# Patient Record
Sex: Male | Born: 1950 | Race: Asian | Hispanic: No | Marital: Married | Smoking: Current every day smoker
Health system: Southern US, Community
[De-identification: ages and names within clinical notes are randomized; demographics above are authoritative.]

## PROBLEM LIST (undated history)

## (undated) DIAGNOSIS — F17200 Nicotine dependence, unspecified, uncomplicated: Secondary | ICD-10-CM

## (undated) DIAGNOSIS — I10 Essential (primary) hypertension: Secondary | ICD-10-CM

## (undated) DIAGNOSIS — I2510 Atherosclerotic heart disease of native coronary artery without angina pectoris: Secondary | ICD-10-CM

## (undated) DIAGNOSIS — K219 Gastro-esophageal reflux disease without esophagitis: Secondary | ICD-10-CM

## (undated) DIAGNOSIS — D352 Benign neoplasm of pituitary gland: Secondary | ICD-10-CM

## (undated) DIAGNOSIS — A99 Unspecified viral hemorrhagic fever: Secondary | ICD-10-CM

## (undated) HISTORY — DX: Gastro-esophageal reflux disease without esophagitis: K21.9

## (undated) HISTORY — DX: Nicotine dependence, unspecified, uncomplicated: F17.200

## (undated) HISTORY — DX: Essential (primary) hypertension: I10

## (undated) HISTORY — DX: Benign neoplasm of pituitary gland: D35.2

## (undated) HISTORY — PX: CARDIAC CATHETERIZATION: SHX172

## (undated) HISTORY — DX: Unspecified viral hemorrhagic fever: A99

## (undated) HISTORY — PX: ESOPHAGOGASTRODUODENOSCOPY: SHX1529

## (undated) HISTORY — PX: APPENDECTOMY: SHX54

## (undated) HISTORY — DX: Atherosclerotic heart disease of native coronary artery without angina pectoris: I25.10

---

## 1991-03-04 DIAGNOSIS — A99 Unspecified viral hemorrhagic fever: Secondary | ICD-10-CM

## 1991-03-04 HISTORY — DX: Unspecified viral hemorrhagic fever: A99

## 2009-03-03 DIAGNOSIS — I2510 Atherosclerotic heart disease of native coronary artery without angina pectoris: Secondary | ICD-10-CM

## 2009-03-03 HISTORY — DX: Atherosclerotic heart disease of native coronary artery without angina pectoris: I25.10

## 2011-01-09 ENCOUNTER — Encounter: Payer: Self-pay | Admitting: Family Medicine

## 2011-01-09 ENCOUNTER — Ambulatory Visit (INDEPENDENT_AMBULATORY_CARE_PROVIDER_SITE_OTHER): Payer: PRIVATE HEALTH INSURANCE | Admitting: Family Medicine

## 2011-01-09 VITALS — BP 143/87 | HR 71 | Temp 97.9°F | Wt 192.0 lb

## 2011-01-09 DIAGNOSIS — R7309 Other abnormal glucose: Secondary | ICD-10-CM

## 2011-01-09 DIAGNOSIS — Z9861 Coronary angioplasty status: Secondary | ICD-10-CM

## 2011-01-09 DIAGNOSIS — F17200 Nicotine dependence, unspecified, uncomplicated: Secondary | ICD-10-CM

## 2011-01-09 DIAGNOSIS — R7303 Prediabetes: Secondary | ICD-10-CM

## 2011-01-09 DIAGNOSIS — Z955 Presence of coronary angioplasty implant and graft: Secondary | ICD-10-CM | POA: Insufficient documentation

## 2011-01-09 DIAGNOSIS — R198 Other specified symptoms and signs involving the digestive system and abdomen: Secondary | ICD-10-CM | POA: Insufficient documentation

## 2011-01-09 DIAGNOSIS — E119 Type 2 diabetes mellitus without complications: Secondary | ICD-10-CM

## 2011-01-09 LAB — COMPREHENSIVE METABOLIC PANEL
ALT: 20 U/L (ref 0–53)
Alkaline Phosphatase: 87 U/L (ref 39–117)
CO2: 28 mEq/L (ref 19–32)
Creatinine, Ser: 0.8 mg/dL (ref 0.4–1.5)
GFR: 101.71 mL/min (ref >60.00)
Sodium: 139 mEq/L (ref 135–145)
Total Bilirubin: 0.8 mg/dL (ref 0.3–1.2)

## 2011-01-09 LAB — CBC WITH DIFFERENTIAL/PLATELET
Basophils Absolute: 0.1 10*3/uL (ref 0.0–0.1)
Basophils Relative: 0.8 % (ref 0.0–3.0)
Eosinophils Relative: 1.4 % (ref 0.0–5.0)
HCT: 43.5 % (ref 39.0–52.0)
Hemoglobin: 14.6 g/dL (ref 13.0–17.0)
Lymphocytes Relative: 21.7 % (ref 12.0–46.0)
Lymphs Abs: 1.7 10*3/uL (ref 0.7–4.0)
Monocytes Relative: 6.6 % (ref 3.0–12.0)
Neutro Abs: 5.4 10*3/uL (ref 1.4–7.7)
RBC: 4.59 Mil/uL (ref 4.22–5.81)
WBC: 7.8 10*3/uL (ref 4.5–10.5)

## 2011-01-09 LAB — LIPID PANEL
Cholesterol: 196 mg/dL (ref 0–200)
HDL: 44.1 mg/dL (ref >39.00)
Total CHOL/HDL Ratio: 4
Triglycerides: 285 mg/dL — ABNORMAL HIGH (ref 0.0–149.0)
VLDL: 57 mg/dL — ABNORMAL HIGH (ref 0.0–40.0)

## 2011-01-09 LAB — LDL CHOLESTEROL, DIRECT: Direct LDL: 106 mg/dL

## 2011-01-09 NOTE — Assessment & Plan Note (Signed)
It appears this was done as a preventative measure per pt and interpreter reports today.  No old records available. He has been on plavix for over a year.  I told him to d/c plavix and continue ASA 81mg  qd. He says he has good exercise tolerance when he does his brisk walking.

## 2011-01-09 NOTE — Progress Notes (Signed)
Quick Note:  Pls notify pt's daughter that his labs showed that he has diabetes that he needs to start taking medication for (he reported to me that he had "mild" diabetes or "borderline" diabetes...also described it as "sugar a little bit up, but not diabetes"). I'll do eRx for metformin. Have them arrange a f/u visit in a week or so and we'll go over his labs in detail, we'll talk about nutritionist consult for diabetic diet teaching, and I'll answer their questions. Thx--PM ______

## 2011-01-09 NOTE — Patient Instructions (Signed)
Abdominal Aortic Aneurysm  An aneurysm is the enlargement (dilatation), bulging, or ballooning out of part of the wall of a vein or artery. An aortic aneurysm is a bulging in the largest artery of the body. This artery supplies blood from the heart to the rest of the body.  The first part of the aorta is called the thoracic aorta. It leaves the heart, rises (ascends), arches, and goes down (descends) through the chest until it reaches the diaphragm. The diaphragm is the muscular part between the chest and abdomen.   The second part of the aorta is called the abdominal aorta after it has passed the diaphragm and continues down through the abdomen. The abdominal aorta ends where it splits to form the two iliac arteries that go to the legs.  Aortic aneurysms can develop anywhere along the length of the aorta. The majority are located along the abdominal aorta. The major concern with an aortic aneurysm is that it can enlarge and rupture. This can cause death unless diagnosed and treated promptly. Aneurysms can also develop blood clots or infections. CAUSES  Many aortic aneurysms are caused by arteriosclerosis. Arteriosclerosis can weaken the aortic wall. The pressure of the blood being pumped through the aorta causes it to balloon out at the site of weakness. Therefore, high blood pressure (hypertension) is associated with aneurysm. Other risk factors include:  Age over 13.   Tobacco use.   Being male.   White race.   Family history of aneurysm.   Less frequent causes of abdominal aortic aneurysms include:   Connective tissue diseases.   Abdominal trauma.   Inflammation of blood vessles (arteritis).   Inherited (congenital) malformations.   Infection.  SYMPTOMS  The signs and symptoms of an unruptured aneurysm will partly depend on its size and rate of growth.   Abdominal aortic aneurysms may cause pain. The pain typically has a deep quality as if it is piercing into the person. It is  felt most often in the lower back area. The pain is usually steady but may be relieved by changing your body position.   The person may also become aware of an abnormally prominent pulse in the belly (abdominal pulsation).  DIAGNOSIS  An aortic aneurysm may be discovered by chance on physical exam, or on X-ray studies done for other reasons. It may be suspected because of other problems such as back or abdominal pain. The following tests may help identify the problem.  X-rays of the abdomen can show calcium deposits in the aneurysm wall.   CT scanning of the abdomen, particularly with contrast medium, is accurate at showing the exact size and shape of the aneurysm.   Ultrasounds give a clear picture of the size of an aneurysm (about 98% accuracy).   MRI scanning is accurate, but often unnecessary.   An abdominal angiogram shows the source of the major blood vessels arising from the aorta. It reveals the size and extent of any aneurysm. It can also show a clot clinging to the wall of the aneurysm (mural thrombus).  TREATMENT  Treating an abdominal aortic aneurysm depends on the size. A rupture of an aneurysm is uncommon when they are less than 5 cm wide (2 inches). Rupture is far more common in aneurysms that are over 6 cm wide (2.4 inches).  Surgical repair is usually recommended for all aneurysms over 6 cm wide (2.4 inches). This depends on the health, age, and other circumstances of the individual. This type of surgery consists of  opening the abdomen, removing the aneurysm, and sewing a synthetic graft (similar to a cloth tube) in its place. A less invasive form of this surgery, using stent grafts, is sometimes recommended.   For most patients, elective repair is recommended for aneurysms between 4 and 6 cm (1.6 and 2.4 inches). Elective means the surgery can be done at your convenience. This should not be put off too long if surgery is recommended.   If you smoke, stop immediately. Smoking  is a major risk factor for enlargement and rupture.   Medications may be used to help decrease complications - these include medicine to lower blood pressure and control cholesterol.  HOME CARE INSTRUCTIONS   If you smoke, stop. Do not start smoking.   Take all medications as prescribed.   Your caregiver will tell you when to have your aneurysm rechecked, either by ultrasound or CT scan.   If your caregiver has given you a follow-up appointment, it is very important to keep that appointment. Not keeping the appointment could result in a chronic or permanent injury, pain, or disability. If there is any problem keeping the appointment, you must call back to this facility for assistance.  SEEK MEDICAL CARE IF:   You develop mild abdominal pain or pressure.   You are able to feel or perceive your aneurysm, and you sense any change.  SEEK IMMEDIATE MEDICAL CARE IF:   You develop severe abdominal pain, or severe pain moving (radiating) to your back.   You suddenly develop cold or blue toes or feet.   You suddenly develop lightheadedness or fainting spells.  MAKE SURE YOU:   Understand these instructions.   Will watch your condition.   Will get help right away if you are not doing well or get worse.  Document Released: 11/27/2004 Document Revised: 10/30/2010 Document Reviewed: 09/21/2007 [facility] Patient Information 2012 [place], [place].

## 2011-01-09 NOTE — Assessment & Plan Note (Signed)
Encouraged complete cessation.  Sounds like he is cutting back a little bit lately. Will discuss more in depth in the future.

## 2011-01-09 NOTE — Assessment & Plan Note (Signed)
I find it difficult to believe he was told he has "a little bit" of diabetes, yet was not placed on any medication nor was he given any recommendations/education about diabetic diet per his report.  Will check HbA1c, CMET, TSH, FLP (he is fasting today).

## 2011-01-09 NOTE — Progress Notes (Signed)
Office Note 01/09/2011  CC:  Chief Complaint  Patient presents with  . Establish Care    HPI:  Lawrence Randall is a 60 y.o. Asian male who is here to establish care. Patient's most recent primary MD: [facility]. Old records were not reviewed prior to or during today's visit.  He is here with his interpreter, Denice Bors.  He knows mandarin only, no english. He is here to visit his daughter for 1 yr, then will return to [place].   Past Medical History  Diagnosis Date  . Borderline diabetes 2012    "sugar a little bit up but not diabetes" is how the patient stated it (no records available)  . Hemorrhagic, fever 1993    No residual effects  . Coronary artery disease 2011    Stents x3, put on plavix and ASA--with further questioning he apparently had no symptoms but he told an MD in [place] that he had some SOB at times and the MD put the stents in as a PROPHYLAXIS against CAD (pt says actual ischemia or CAD was NOT confirmed--no records avail.  . Tobacco dependence     1/2 ppd x 40 yrs  He denies any history of hypertension  Past Surgical History  Procedure Date  . Appendectomy age 28  . Esophagogastroduodenoscopy     Done for upper abd pains: normal per pt report.    Family History  Problem Relation Age of Onset  . Heart disease Mother     chf  . Diabetes Mother     History   Social History  . Marital Status: Married    Spouse Name: N/A    Number of Children: N/A  . Years of Education: N/A   Occupational History  . Not on file.   Social History Main Topics  . Smoking status: Current Everyday Smoker -- 0.5 packs/day for 40 years    Types: Cigarettes  . Smokeless tobacco: Never Used  . Alcohol Use: Not on file  . Drug Use: Not on file  . Sexually Active: Not on file   Other Topics Concern  . 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  Occasional beer.  No drug use.Exercise: fast walking most days about 2mi.Eats sensible diet.    Outpatient Encounter Prescriptions as of 01/09/2011  Medication Sig Dispense Refill  . aspirin 81 MG tablet Take 81 mg by mouth daily.        . clopidogrel (PLAVIX) 75 MG tablet Take 75 mg by mouth daily.          Allergies  Allergen Reactions  . Amoxil Itching    Plus rash and swollen hands    ROS Review of Systems  Constitutional: Negative for fever and fatigue.  HENT: Negative for congestion and sore throat.   Eyes: Negative for visual disturbance.  Respiratory: Negative for cough.   Cardiovascular: Negative for chest pain.  Gastrointestinal: Negative for nausea, abdominal pain and blood in stool.  Genitourinary: Negative for dysuria.  Musculoskeletal: Negative for back pain and joint swelling.  Skin: Negative for rash.  Neurological: Negative for weakness and headaches.  Hematological: Negative for adenopathy.  Psychiatric/Behavioral: Negative for dysphoric mood.    PE; Blood pressure 143/87, pulse 71, temperature 97.9 F (36.6 C), temperature source Oral, weight 192 lb (87.091 kg), SpO2 98.00%. Gen: Alert, well  appearing.  Patient is oriented to person, place, time, and situation. ENT: Ears: EACs clear, normal epithelium.  TMs with good light reflex and landmarks bilaterally.  Eyes: no injection, icteris, swelling, or exudate.  EOMI, PERRLA. Nose: no drainage or turbinate edema/swelling.  No injection or focal lesion.  Mouth: lips without lesion/swelling.  Oral mucosa pink and moist.  Dentition intact and without obvious caries or gingival swelling.  Oropharynx without erythema, exudate, or swelling.  Neck - No masses or thyromegaly or limitation in range of motion Chest with symmetric expansion, good aeration, nonlabored respirations.  Clear and equal breath sounds in all lung fields.  No clubbing or cyanosis. RRR, without murmur, rub,  or gallop. Carotid pulses easily palpable, symmetric pulsations, no bruit or palpable dilatation. Radial, femoral, and ankle pulses easily palpable and symmetric. No abdominal bruit. No varicosities.  Cap refill in extremities is brisk. ABD: soft, NT, ND, BS normal.  No hepatospenomegaly.  He does have an easily evident pulsatile abdomen in midline just superior to the umbillicus.  Nontender.  No bruit.  EXT: no clubbing, cyanosis, or edema.   Pertinent labs:  None today  ASSESSMENT AND PLAN:   New pt today: no old records available (all in [place] and they would be in [place]). Language barrier present but he insists he will always have his interpreter with him OR his daughter with him.  Pulsatile abdomen Age+smoking hx+ question of DM 2---he definitely needs u/s screening for AAA.  Will set this up ASAP.  S/P coronary artery stent placement It appears this was done as a preventative measure per pt and interpreter reports today.  No old records available. He has been on plavix for over a year.  I told him to d/c plavix and continue ASA 81mg  qd. He says he has good exercise tolerance when he does his brisk walking.  Type II or unspecified type diabetes mellitus without mention of complication, not stated as uncontrolled I find it difficult to believe he was told he has "a little bit" of diabetes, yet was not placed on any medication nor was he given any recommendations/education about diabetic diet per his report.  Will check HbA1c, CMET, TSH, FLP (he is fasting today).  Tobacco dependence Encouraged complete cessation.  Sounds like he is cutting back a little bit lately. Will discuss more in depth in the future.     Return in about 4 months (around 05/09/2011) for f/u borderline DM.

## 2011-01-09 NOTE — Assessment & Plan Note (Signed)
Age+smoking hx+ question of DM 2---he definitely needs u/s screening for AAA.  Will set this up ASAP.

## 2011-01-10 ENCOUNTER — Ambulatory Visit (HOSPITAL_BASED_OUTPATIENT_CLINIC_OR_DEPARTMENT_OTHER)
Admission: RE | Admit: 2011-01-10 | Discharge: 2011-01-10 | Disposition: A | Discharge status: Home / Self Care | Payer: PRIVATE HEALTH INSURANCE | Source: Ambulatory Visit | Attending: Family Medicine | Admitting: Family Medicine

## 2011-01-10 DIAGNOSIS — R198 Other specified symptoms and signs involving the digestive system and abdomen: Secondary | ICD-10-CM

## 2011-01-10 DIAGNOSIS — F17200 Nicotine dependence, unspecified, uncomplicated: Secondary | ICD-10-CM | POA: Insufficient documentation

## 2011-01-10 DIAGNOSIS — K7689 Other specified diseases of liver: Secondary | ICD-10-CM | POA: Insufficient documentation

## 2011-01-10 MED ORDER — METFORMIN HCL 500 MG PO TABS: 500.0000 mg | ORAL_TABLET | Freq: Two times a day (BID) | ORAL | Status: DC

## 2011-01-10 NOTE — Progress Notes (Signed)
OK.  Rx sent to [facility]

## 2011-01-16 ENCOUNTER — Encounter: Payer: Self-pay | Admitting: Family Medicine

## 2011-01-16 ENCOUNTER — Ambulatory Visit (INDEPENDENT_AMBULATORY_CARE_PROVIDER_SITE_OTHER): Payer: PRIVATE HEALTH INSURANCE | Admitting: Family Medicine

## 2011-01-16 VITALS — BP 118/74 | HR 78 | Ht 68.5 in | Wt 193.0 lb

## 2011-01-16 DIAGNOSIS — Z23 Encounter for immunization: Secondary | ICD-10-CM

## 2011-01-16 DIAGNOSIS — E119 Type 2 diabetes mellitus without complications: Secondary | ICD-10-CM

## 2011-01-16 DIAGNOSIS — J069 Acute upper respiratory infection, unspecified: Secondary | ICD-10-CM

## 2011-01-16 MED ORDER — METFORMIN HCL 1000 MG PO TABS: 1000.0000 mg | ORAL_TABLET | Freq: Two times a day (BID) | ORAL | Status: DC

## 2011-01-16 MED ORDER — FREESTYLE LANCETS MISC: Status: AC

## 2011-01-16 MED ORDER — GLUCOSE BLOOD VI STRP: ORAL_STRIP | Status: AC

## 2011-01-16 NOTE — Progress Notes (Signed)
OFFICE NOTE  01/16/2011  CC:  Chief Complaint  Patient presents with  . Diabetes    discuss labs     HPI:   Patient is a 60 y.o. Asian male who is here for f/u for recent new dx of DM 2.  He's here with his interpreter today. He recently had u/s to eval for AAA b/c he had pulsatile abdomen and is a smoker: this showed NO AAA. I started him on metformin 500mg  bid about a week ago (b/c random gluc 198 and HbA1c 9.4%) and he's taking it and tolerating it w/out any side effects.  He has no glucometer.  He's had 4d hx of nasal congestion, runny nose, ST, sneezing, HA, and cough.  No wheezing or SOB or fever or body aches. No meds tried for these sx's.  He is going on a 1 wk cruise in the [place] and will leaving tomorrow.  Pertinent PMH:  DM2, non insulin-requiring Tob dependence  MEDS;   Outpatient Prescriptions Prior to Visit  Medication Sig Dispense Refill  . aspirin 81 MG tablet Take 81 mg by mouth daily.        . clopidogrel (PLAVIX) 75 MG tablet Take 75 mg by mouth daily.        . metFORMIN (GLUCOPHAGE) 500 MG tablet Take 1 tablet (500 mg total) by mouth 2 (two) times daily with a meal.  60 tablet  1    PE: Blood pressure 118/74, pulse 78, height 5' 8.5" (1.74 m), weight 193 lb (87.544 kg). VS: noted--normal. Gen: alert, NAD, NONTOXIC APPEARING. HEENT: eyes without injection, drainage, or swelling.  Ears: EACs clear, TMs with normal light reflex and landmarks.  Nose: Clear rhinorrhea, with some dried, crusty exudate adherent to mildly injected mucosa.  No purulent d/c.  No paranasal sinus TTP.  No facial swelling.  Throat and mouth without focal lesion.  No pharyngial swelling, erythema, or exudate.   Neck: supple, no LAD.   LUNGS: CTA bilat, nonlabored resps.   CV: RRR, no m/r/g. EXT: no c/c/e SKIN: no rash  LABS: capillary Hb today was 179  IMPRESSION AND PLAN:  Type II or unspecified type diabetes mellitus without mention of complication, not stated as  uncontrolled Pt declined to see nutritionist.  Wants to eat whatever he wants and just take whatever med is required to "control" things. Gave glucometer today and asked him to check gluc fasting daily and a 2H PP once per day and we'll see him back to review these in 3-4 wks. Increase metformin to 1000 mg bid today.  Discussed the basics of routine diabetic medical follow up: ophtho annually, urine microalb/cr annually, HbA1c and lipids periodically, diabetic foot exam annually.  We will review various disease education aspects of DM 2 at each visit. He declined flu vaccine today but we did give him pneumovax today.   Viral URI Self-limited nature of this illness was discussed, questions answered.  Discussed symptomatic care, rest, fluids.   Warning signs/symptoms of worsening illness were discussed.  Patient instructed to call or return if any of these occur. Mucinex DM OTC and tylenol prn recommended.       FOLLOW UP:  Return 3-4 wks, for f/u dm 2.

## 2011-01-16 NOTE — Assessment & Plan Note (Addendum)
Pt declined to see nutritionist.  Wants to eat whatever he wants and just take whatever med is required to "control" things. Gave glucometer today and asked him to check gluc fasting daily and a 2H PP once per day and we'll see him back to review these in 3-4 wks. Increase metformin to 1000 mg bid today.  Discussed the basics of routine diabetic medical follow up: ophtho annually, urine microalb/cr annually, HbA1c and lipids periodically, diabetic foot exam annually.  We will review various disease education aspects of DM 2 at each visit. He declined flu vaccine today but we did give him pneumovax today.

## 2011-01-16 NOTE — Patient Instructions (Signed)
Buy over the counter med called Mucinex DM and take 1 tab every 12 hours as needed for your cold symptoms. Buy over the counter eye moisturizer drops at your pharmacy for your dry eyes. Take tylenol [phone] mg every 6 hours as needed for head ache and sore throat pain.

## 2011-01-16 NOTE — Assessment & Plan Note (Signed)
Self-limited nature of this illness was discussed, questions answered.  Discussed symptomatic care, rest, fluids.   Warning signs/symptoms of worsening illness were discussed.  Patient instructed to call or return if any of these occur. Mucinex DM OTC and tylenol prn recommended.

## 2011-02-03 ENCOUNTER — Encounter: Payer: Self-pay | Admitting: Family Medicine

## 2011-02-03 ENCOUNTER — Ambulatory Visit (INDEPENDENT_AMBULATORY_CARE_PROVIDER_SITE_OTHER): Payer: PRIVATE HEALTH INSURANCE | Admitting: Family Medicine

## 2011-02-03 VITALS — BP 137/77 | HR 71 | Ht 68.5 in | Wt 191.0 lb

## 2011-02-03 DIAGNOSIS — E119 Type 2 diabetes mellitus without complications: Secondary | ICD-10-CM

## 2011-02-03 LAB — MICROALBUMIN / CREATININE URINE RATIO
Creatinine,U: 91.6 mg/dL
Microalb Creat Ratio: 0.5 mg/g (ref 0.0–30.0)

## 2011-02-03 MED ORDER — GLYBURIDE 5 MG PO TABS: 5.0000 mg | ORAL_TABLET | Freq: Every day | ORAL | Status: DC

## 2011-02-03 NOTE — Assessment & Plan Note (Signed)
Through his interpreter it seems apparent that he'll follow whatever course of treatment I recommend, but he clearly expresses interest in insulin since this is what his mom was on. However, I am not confident at this time that he is prepared for the more intense self management skills you must have once you begin insulin, particularly mealtime AND hs long acting insulin (which is what he mentioned today). I am going to recommend we simply add glyburide 5mg  at this time, increase to 10mg  after 7d if glucoses avg is still >140.  Therapeutic expectations and side effect profile of medication discussed today.  Patient's questions answered.  Continue bid glucose checks.  Check urine microalb/cr today. At each f/u visit we'll try to educate more, get one more screening test done (needs foot exam or referral for diab retin screening next f/u). Spent 25 min with pt and interpreter today, with >50% of this time spent in counseling and coordination of care for his DM 2.

## 2011-02-03 NOTE — Patient Instructions (Signed)
Continue to check your glucose TWICE daily (one time in the morning when fasting and one time 2 hours AFTER a meal).

## 2011-02-03 NOTE — Progress Notes (Signed)
OFFICE NOTE  02/03/2011  CC:  Chief Complaint  Patient presents with  . Diabetes    follow up     HPI:   Patient is a 60 y.o. Asian male who is here for f/u DM 2.  Here with his interpreter today. Feeling well. Compliant with metformin at 1000mg  bid since our last visit about 2-3 wks ago, no side effects. Still eats whatever he wants, does no exercise.  Still smoking. Glucoses reviewed: avg around 170, range 120s (rarely) up to around 300.  This is a bit improved.   Pertinent PMH:  DM 2, non insulin-requiring Tobacco dependence Coronary stent placement (w/out clear evidence of CAD prior?) Language barrier (mandarin [place])  Past surgical, family, and social history reviewed and there are no changes since the patient's last office visit with me.  MEDS;   Outpatient Prescriptions Prior to Visit  Medication Sig Dispense Refill  . aspirin 81 MG tablet Take 81 mg by mouth daily.        . clopidogrel (PLAVIX) 75 MG tablet Take 75 mg by mouth daily.        Marland Kitchen glucose blood (FREESTYLE LITE) test strip Check glucose twice per day  100 each  5  . Lancets (FREESTYLE) lancets Check glucose twice daily  100 each  5  . metFORMIN (GLUCOPHAGE) 1000 MG tablet Take 1 tablet (1,000 mg total) by mouth 2 (two) times daily with a meal.  60 tablet  2    PE: Blood pressure 137/77, pulse 71, height 5' 8.5" (1.74 m), weight 191 lb (86.637 kg). Gen: Alert, well appearing.  Patient is oriented to person, place, time, and situation. No further exam today.  IMPRESSION AND PLAN:  Type II or unspecified type diabetes mellitus without mention of complication, not stated as uncontrolled Through his interpreter it seems apparent that he'll follow whatever course of treatment I recommend, but he clearly expresses interest in insulin since this is what his mom was on. However, I am not confident at this time that he is prepared for the more intense self management skills you must have once you begin  insulin, particularly mealtime AND hs long acting insulin (which is what he mentioned today). I am going to recommend we simply add glyburide 5mg  at this time, increase to 10mg  after 7d if glucoses avg is still >140.  Therapeutic expectations and side effect profile of medication discussed today.  Patient's questions answered.  Continue bid glucose checks.  Check urine microalb/cr today. At each f/u visit we'll try to educate more, get one more screening test done (needs foot exam or referral for diab retin screening next f/u). Spent 25 min with pt and interpreter today, with >50% of this time spent in counseling and coordination of care for his DM 2.       FOLLOW UP:  Return in about 1 month (around 03/06/2011) for f/u DM 2.

## 2011-02-17 ENCOUNTER — Ambulatory Visit (INDEPENDENT_AMBULATORY_CARE_PROVIDER_SITE_OTHER): Payer: PRIVATE HEALTH INSURANCE | Admitting: Family Medicine

## 2011-02-17 ENCOUNTER — Encounter: Payer: Self-pay | Admitting: Family Medicine

## 2011-02-17 VITALS — BP 121/78 | HR 75 | Ht 68.5 in | Wt 189.0 lb

## 2011-02-17 DIAGNOSIS — E119 Type 2 diabetes mellitus without complications: Secondary | ICD-10-CM

## 2011-02-17 MED ORDER — LIRAGLUTIDE 18 MG/3ML ~~LOC~~ SOLN: SUBCUTANEOUS | Status: DC

## 2011-02-17 NOTE — Progress Notes (Signed)
OFFICE NOTE  02/17/2011  CC:  Chief Complaint  Patient presents with  . Diabetes    follow up     HPI: Patient is a 60 y.o. Asian male who is here for f/u DM 2.  States that he prefers to see the doctor more often so he's here earlier than usual for routine  DM f/u.  Started glyburide 5mg  qAM last visit, says it is making glucoses avg 120 fasting and 220 2h pp.  However, he has significant nausea and bloating on the medication, no sign of this improving.  No vomiting.  Pertinent PMH:  Past Medical History  Diagnosis Date  . Diabetes mellitus 2012    "sugar a little bit up but not diabetes" is how the patient stated it (no records available).  HbA1c here in fall 2012 was UP.  Marland Kitchen Hemorrhagic, fever 1993    No residual effects  . Coronary artery disease 2011    Stents x3, put on plavix and ASA--with further questioning he apparently had no symptoms but he told an MD in [place] that he had some SOB at times and the MD put the stents in as a PROPHYLAXIS against CAD (pt says actual ischemia or CAD was NOT confirmed--no records avail.  . Tobacco dependence     1/2 ppd x 40 yrs   Past Surgical History  Procedure Date  . Appendectomy age 58  . Esophagogastroduodenoscopy     Done for upper abd pains: normal per pt report.   Past family and social history reviewed and there are no changes since the patient's last office visit with me.  Pertinent Meds:  PE: Blood pressure 121/78, pulse 75, height 5' 8.5" (1.74 m), weight 189 lb (85.73 kg). Gen: Alert, well appearing.  Patient is oriented to person, place, time, and situation. CV: RRR, no m/r/g LUNGS: CTA bilat, nonlabored resps.  LAB:  Lab Results  Component Value Date   HGBA1C 9.4* 01/09/2011     IMPRESSION AND PLAN: Type II or unspecified type diabetes mellitus without mention of complication, not stated as uncontrolled Intolerant of glyburide--GI upset. D/c glyburide. Start liraglutide 0.6 SQ qd x 7d, then increase to 1.2  qd.  Therapeutic expectations and side effect profile of medication discussed today.  Patient's questions answered.  Continue metformin. Continue bid glucose checks.  He asked if he could be rx'd metoprolol b/c he was taking this in [place] but he could not clearly state what problem he had: he did not seem to have any hx of tachyarrythmia or even palpitations.  He simply said his HR was in the 60s when he was younger but now it is higher (70s-80s) and he bought this med in [place] b/c you can get it OTC there.  I declined to rx this for him here unless he has a clear indication for it. He also asked to be rx'd zocor, as this was another med he was on in [place] (again, OTC there).  I plan on starting a statin here but was going to recheck his lipid panel in a couple of months when his DM is under better control. Interpreter explained this to him today and he's okay with this plan.      FOLLOW UP: 2 wks

## 2011-02-17 NOTE — Assessment & Plan Note (Addendum)
Intolerant of glyburide--GI upset. D/c glyburide. Start liraglutide 0.6 SQ qd x 7d, then increase to 1.2 qd.  Therapeutic expectations and side effect profile of medication discussed today.  Patient's questions answered.  Continue metformin. Continue bid glucose checks.  He asked if he could be rx'd metoprolol b/c he was taking this in [place] but he could not clearly state what problem he had: he did not seem to have any hx of tachyarrythmia or even palpitations.  He simply said his HR was in the 60s when he was younger but now it is higher (70s-80s) and he bought this med in [place] b/c you can get it OTC there.  I declined to rx this for him here unless he has a clear indication for it. He also asked to be rx'd zocor, as this was another med he was on in [place] (again, OTC there).  I plan on starting a statin here but was going to recheck his lipid panel in a couple of months when his DM is under better control. Interpreter explained this to him today and he's okay with this plan.

## 2011-03-06 ENCOUNTER — Telehealth: Payer: Self-pay | Admitting: Family Medicine

## 2011-03-06 ENCOUNTER — Ambulatory Visit (INDEPENDENT_AMBULATORY_CARE_PROVIDER_SITE_OTHER): Payer: PRIVATE HEALTH INSURANCE | Admitting: Family Medicine

## 2011-03-06 ENCOUNTER — Encounter: Payer: Self-pay | Admitting: Family Medicine

## 2011-03-06 VITALS — BP 117/77 | HR 67 | Temp 97.8°F | Ht 68.5 in | Wt 185.0 lb

## 2011-03-06 DIAGNOSIS — IMO0001 Reserved for inherently not codable concepts without codable children: Secondary | ICD-10-CM

## 2011-03-06 MED ORDER — INSULIN PEN NEEDLE 31G X 5 MM MISC: Status: AC

## 2011-03-06 NOTE — Telephone Encounter (Signed)
RX sent. Ally notified.

## 2011-03-06 NOTE — Telephone Encounter (Signed)
Pls contact intreperter (patient signed DPR), patient contacted Connye Burkitt & ask that she ask a question for him

## 2011-03-06 NOTE — Patient Instructions (Addendum)
Increase your Lantus dose by 1 unit every night until your fasting morning glucose is in the range of 100-110.  Continue this dosing once you reach this goal. For constipation, try generic senakot S tabs, take 2 tabs at bedtime every night.

## 2011-03-06 NOTE — Progress Notes (Signed)
OFFICE NOTE  03/06/2011  CC:  Chief Complaint  Patient presents with  . Diabetes    follow up     HPI: Patient is a 61 y.o. [place] male who is here for DM 2 follow up with his interpreter. Still eats whatever he wants.  Monitors fasting glucose daily and once again 2 hrs after a meal. Glucoses improved (100s) but pt c/o intolerable GI upset lately.  Seems to be since being on metformin, sounds like he is taking it three times per day instead of bid, also hard to tell if vicotoza is adding to his GI upset or not, but likely it is.  No vomiting, no diarrhea: just bloating, nausea, upset stomach in general.  Also, BMs hard and infrequent.  Worse after eating, has not tried anything to make it better. No fever, no chest pain.  Pertinent PMH:  DM 2, poor control Tobacco dependence  Pertinent Meds: Liraglutide 1.2mg  qd, metformin 1000mg  bid (taking tid apparently), ASA 81mg , PLavix 75mg  qd  Past surgical, family, and social history reviewed and there are no changes since the patient's last office visit with me.  PE: Blood pressure 117/77, pulse 67, temperature 97.8 F (36.6 C), temperature source Temporal, height 5' 8.5" (1.74 m), weight 185 lb (83.915 kg). Gen: Alert, well appearing.  Patient is oriented to person, place, time, and situation. No further exam today.  LAB:  Lab Results  Component Value Date   HGBA1C 9.4* 01/09/2011   Lab Results  Component Value Date   MICROALBUR 0.5 02/03/2011     IMPRESSION AND PLAN: 1) DM 2, control improved on metformin and liraglutide. However, GI side effects from meds (not helped by taking metformin tid instead of bid). Ongoing noncompliance with diet.   2) Tobacco dependence; pt encouraged to quit but he doesn't want to.  Plan is to d/c metformin and liraglutide. Change to lantus 10 units qhs, increase by 1 unit each night until fasting glucose gets into the range of 100-110. Recommended/offered referral to dietitian once again today  but he is not interested.  Says he knows what to eat but just won't comply.  Encouraged pt to use senakot S generic OTC 2 tabs qhs for constipation.   Needs foot exam and diab retpthy screening exam soon. Spent 25 min with pt today, with >50% of this time spent in counseling and care coordination for diabetes.  FOLLOW UP: 2 wks

## 2011-03-06 NOTE — Telephone Encounter (Signed)
Ally called back to let us know that patient's needles "did not fit", he will need a new Rx, please call her

## 2011-03-08 ENCOUNTER — Encounter: Payer: Self-pay | Admitting: Family Medicine

## 2011-03-19 ENCOUNTER — Ambulatory Visit (INDEPENDENT_AMBULATORY_CARE_PROVIDER_SITE_OTHER): Payer: PRIVATE HEALTH INSURANCE | Admitting: Family Medicine

## 2011-03-19 ENCOUNTER — Other Ambulatory Visit: Payer: Self-pay

## 2011-03-19 ENCOUNTER — Emergency Department (INDEPENDENT_AMBULATORY_CARE_PROVIDER_SITE_OTHER): Payer: PRIVATE HEALTH INSURANCE

## 2011-03-19 ENCOUNTER — Emergency Department (HOSPITAL_BASED_OUTPATIENT_CLINIC_OR_DEPARTMENT_OTHER)
Admission: EM | Admit: 2011-03-19 | Discharge: 2011-03-19 | Disposition: A | Discharge status: Home / Self Care | Payer: PRIVATE HEALTH INSURANCE | Attending: Emergency Medicine | Admitting: Emergency Medicine

## 2011-03-19 ENCOUNTER — Encounter: Payer: Self-pay | Admitting: Family Medicine

## 2011-03-19 ENCOUNTER — Encounter (HOSPITAL_BASED_OUTPATIENT_CLINIC_OR_DEPARTMENT_OTHER): Payer: Self-pay | Admitting: *Deleted

## 2011-03-19 DIAGNOSIS — E119 Type 2 diabetes mellitus without complications: Secondary | ICD-10-CM | POA: Insufficient documentation

## 2011-03-19 DIAGNOSIS — R079 Chest pain, unspecified: Secondary | ICD-10-CM | POA: Insufficient documentation

## 2011-03-19 DIAGNOSIS — F17200 Nicotine dependence, unspecified, uncomplicated: Secondary | ICD-10-CM | POA: Insufficient documentation

## 2011-03-19 LAB — COMPREHENSIVE METABOLIC PANEL
ALT: 13 U/L (ref 0–53)
Alkaline Phosphatase: 72 U/L (ref 39–117)
BUN: 13 mg/dL (ref 6–23)
CO2: 25 mEq/L (ref 19–32)
Chloride: 101 mEq/L (ref 96–112)
GFR calc Af Amer: >90 mL/min (ref >90)
GFR calc non Af Amer: >90 mL/min (ref >90)
Glucose, Bld: 269 mg/dL — ABNORMAL HIGH (ref 70–99)
Potassium: 4.2 mEq/L (ref 3.5–5.1)
Sodium: 137 mEq/L (ref 135–145)
Total Bilirubin: 0.3 mg/dL (ref 0.3–1.2)

## 2011-03-19 LAB — CARDIAC PANEL(CRET KIN+CKTOT+MB+TROPI)
Relative Index: INVALID (ref 0.0–2.5)
Total CK: 56 U/L (ref 7–232)

## 2011-03-19 LAB — CBC
HCT: 39.5 % (ref 39.0–52.0)
Hemoglobin: 13.7 g/dL (ref 13.0–17.0)
MCHC: 34.7 g/dL (ref 30.0–36.0)
RBC: 4.4 MIL/uL (ref 4.22–5.81)
WBC: 7.3 10*3/uL (ref 4.0–10.5)

## 2011-03-19 MED ORDER — SODIUM CHLORIDE 0.9 % IV SOLN
INTRAVENOUS | Status: DC
  Administered 2011-03-19: 16:00:00 via INTRAVENOUS

## 2011-03-19 MED ORDER — INSULIN ASPART 100 UNIT/ML ~~LOC~~ SOLN: 5.0000 [IU] | Freq: Three times a day (TID) | SUBCUTANEOUS | Status: DC

## 2011-03-19 MED ORDER — ASPIRIN 81 MG PO CHEW
324.0000 mg | CHEWABLE_TABLET | Freq: Once | ORAL | Status: AC
  Administered 2011-03-19: 324 mg via ORAL
  Filled 2011-03-19: qty 4

## 2011-03-19 MED ORDER — NITROGLYCERIN 0.4 MG SL SUBL: 0.4000 mg | SUBLINGUAL_TABLET | SUBLINGUAL | Status: AC | PRN

## 2011-03-19 NOTE — ED Provider Notes (Addendum)
History     CSN: [id number]  Arrival date & time 03/19/11  1502   First MD Initiated Contact with Patient 03/19/11 1542      Chief Complaint  Patient presents with  . Chest Pain    (Consider location/radiation/quality/duration/timing/severity/associated sxs/prior treatment) HPI Comments: Lawrence Randall is a 61 year old man complains of chest pain. He had been seen by his physician,Philip Marvel Plan, M.D., who had spoken to the patient through a [place] interpreter. He had advised the patient to come to [facility] for evaluation. The patient speaks only [place]. Through the interpreter service of [employer], I obtained his history. He has had chest pain on and off for several days. The pain is a stuffy and tight feeling. He had 3 stents placed a year and a half ago in [place]. He has recently been discovered to be diabetic, and is on Lantus insulin. He is on aspirin and Plavix every day.  Patient is a 60 y.o. male presenting with chest pain.  Chest Pain The chest pain began 2 days ago. Episode Length: He has had intermittent chest pain for the past 2 days. Associated with: There is no history of pain with exertion. He has not taken any new medication for the pain. He does not have nitroglycerin to take. He describes the pain as a mild pain, is not quantifying it  on the pain scale. The pain does not radiate. Exacerbated by: There are no features to worsen his pain or improve it. However, he says that after the nurse gave him aspirin he felt better. Pertinent negatives for primary symptoms include no fatigue. He tried aspirin (Aspirin was ordered for him as part of his chest pain workup.) for the symptoms.  His past medical history is significant for CAD and diabetes.     Past Medical History  Diagnosis Date  . Diabetes mellitus 2012    "sugar a little bit up but not diabetes" is how the patient stated it (no records available).  HbA1c here in fall 2012 was UP.  Marland Kitchen Hemorrhagic,  fever 1993    No residual effects  . Coronary artery disease 2011    Stents x3, put on plavix and ASA--with further questioning he apparently had no symptoms but he told an MD in [place] that he had some SOB at times and the MD put the stents in as a PROPHYLAXIS against CAD (pt says actual ischemia or CAD was NOT confirmed--no records available.  I encouraged patient to go ahead and d/c plavix after he had been on it for one year but he insists on continuing it.  . Tobacco dependence     1/2 ppd x 40 yrs    Past Surgical History  Procedure Date  . Appendectomy age 15  . Esophagogastroduodenoscopy     Done for upper abd pains: normal per pt report.    Family History  Problem Relation Age of Onset  . Heart disease Mother     chf  . Diabetes Mother     History  Substance Use Topics  . Smoking status: Current Everyday Smoker -- 0.5 packs/day for 40 years    Types: Cigarettes  . Smokeless tobacco: Never Used  . Alcohol Use: Not on file      Review of Systems  Constitutional: Negative.  Negative for chills and fatigue.  HENT: Negative.   Eyes: Negative.   Respiratory: Negative.   Cardiovascular: Positive for chest pain.  Gastrointestinal: Negative.   Genitourinary: Negative.  Musculoskeletal: Negative.   Skin: Negative.   Neurological: Negative.   Psychiatric/Behavioral: Negative.     Allergies  Amoxil  Home Medications   Current Outpatient Rx  Name Route Sig Dispense Refill  . ASPIRIN 81 MG PO TABS Oral Take 81 mg by mouth daily.      Marland Kitchen CLOPIDOGREL BISULFATE 75 MG PO TABS Oral Take 75 mg by mouth daily.      Marland Kitchen GLUCOSE BLOOD VI STRP  Check glucose twice per day 100 each 5  . INSULIN ASPART 100 UNIT/ML [place] SOLN Subcutaneous Inject 5 Units into the skin 3 (three) times daily before meals. 1 vial 12  . INSULIN GLARGINE 100 UNIT/ML [place] SOLN Subcutaneous Inject 17 Units into the skin at bedtime.     . INSULIN PEN NEEDLE 31G X 5 MM MISC  Use as directed for insulin  injections 30 each 2  . FREESTYLE LANCETS MISC  Check glucose twice daily 100 each 5    BP 138/71  Pulse 70  Temp(Src) 97.9 F (36.6 C) (Oral)  Resp 20  SpO2 98%  Physical Exam  Nursing note and vitals reviewed. Constitutional: He is oriented to person, place, and time. He appears well-developed and well-nourished. No distress.  HENT:  Head: Normocephalic and atraumatic.  Right Ear: External ear normal.  Left Ear: External ear normal.  Mouth/Throat: Oropharynx is clear and moist.  Eyes: Conjunctivae and EOM are normal. Pupils are equal, round, and reactive to light.  Neck: Normal range of motion. Neck supple.  Cardiovascular: Normal rate, regular rhythm and normal heart sounds.   Pulmonary/Chest: Effort normal and breath sounds normal.  Abdominal: Soft. Bowel sounds are normal.  Musculoskeletal: Normal range of motion. He exhibits no edema and no tenderness.  Neurological: He is alert and oriented to person, place, and time.       No sensory or motor deficits.  Skin: Skin is warm and dry.  Psychiatric: He has a normal mood and affect. His behavior is normal.    ED Course  Procedures (including critical care time)   Labs Reviewed  CBC  COMPREHENSIVE METABOLIC PANEL  CARDIAC PANEL(CRET KIN+CKTOT+MB+TROPI)   3:44 PM  Date: 03/19/2011  Rate: 69  Rhythm: normal sinus rhythm  QRS Axis: left  Intervals: normal QRS:  Left Ventricular Hypertrophy, incomplete right bundle branch block.  ST/T Wave abnormalities: nonspecific T wave changes  Conduction Disutrbances:  Incomplete right bundle branch block.  Narrative Interpretation: Abnormal EKG  Old EKG Reviewed: none available  4:56 PM Patient was seen and had physical exam shortly after arrival. EKG was nonacute. [employer] was contacted, and his history was obtained. Laboratory tests are pending.  5:57 PM Results for orders placed during the hospital encounter of 03/19/11  CBC      Component Value Range   WBC  7.3  4.0 - 10.5 (K/uL)   RBC 4.40  4.22 - 5.81 (MIL/uL)   Hemoglobin 13.7  13.0 - 17.0 (g/dL)   HCT 16.1  09.6 - 04.5 (%)   MCV 89.8  78.0 - 100.0 (fL)   MCH 31.1  26.0 - 34.0 (pg)   MCHC 34.7  30.0 - 36.0 (g/dL)   RDW 40.9  81.1 - 91.4 (%)   Platelets 154  150 - 400 (K/uL)  COMPREHENSIVE METABOLIC PANEL      Component Value Range   Sodium 137  135 - 145 (mEq/L)   Potassium 4.2  3.5 - 5.1 (mEq/L)   Chloride 101  96 - 112 (mEq/L)  CO2 25  19 - 32 (mEq/L)   Glucose, Bld 269 (*) 70 - 99 (mg/dL)   BUN 13  6 - 23 (mg/dL)   Creatinine, Ser 5.40  0.50 - 1.35 (mg/dL)   Calcium 9.3  8.4 - 98.1 (mg/dL)   Total Protein 7.3  6.0 - 8.3 (g/dL)   Albumin 4.2  3.5 - 5.2 (g/dL)   AST 12  0 - 37 (U/L)   ALT 13  0 - 53 (U/L)   Alkaline Phosphatase 72  39 - 117 (U/L)   Total Bilirubin 0.3  0.3 - 1.2 (mg/dL)   GFR calc non Af Amer >90  >90 (mL/min)   GFR calc Af Amer >90  >90 (mL/min)  CARDIAC PANEL(CRET KIN+CKTOT+MB+TROPI)      Component Value Range   Total CK 56  7 - 232 (U/L)   CK, MB 1.6  0.3 - 4.0 (ng/mL)   Troponin I <0.30  <0.30 (ng/mL)   Relative Index RELATIVE INDEX IS INVALID  0.0 - 2.5    Dg Chest 2 View  03/19/2011  *RADIOLOGY REPORT*  Clinical Data: Chest pain and diabetes.  CHEST - 2 VIEW  Comparison: None.  Findings: Lungs are clear.  Heart size is normal.  No pneumothorax or effusion.  IMPRESSION: No acute disease.  Original Report Authenticated By: Bernadene Bell. D'ALESSIO, M.D.   5:58 PM Laboratory testing showed an EKG with left ventricular hypertrophy, but no acute change. Cardiac markers were negative. The patient's blood sugar was elevated at 269. I discussed the patient's laboratory testing with him. His tests did not show any heart attack. I advised him that the safest thing would be for him to be admitted for cardiac observation to the hospital. He did not wish to do this. I further advised him that if he had worsening chest pain he could have a heart attack and that kill them.  He is aware of that risk. He asked me for medicine for his chest pain. I told him we could prescribe him some nitroglycerin if he insisted on going home. He did decided he wanted to go home. He wanted referrals for cardiac evaluation as an outpatient. I advised him that Dr. Marvel Plan, his primary Dr., could do this for him. I will try to contact Dr. Lynford Humphrey who informed him of the patient's visit and the results of the testing. The patient is quite competent to make a informed decision about leaving and not going to the hospital.  6:10 PM Case discussed with Thomos Lemons, M.D., covering for Dr. Marvel Plan. He will inform Dr. Lynford Humphrey tomorrow of the patient's decision and the need for outpatient workup for chest pain.   1. Chest pain          Carleene Cooper III, MD 03/19/11 1805  Carleene Cooper III, MD 03/19/11 [phone]

## 2011-03-19 NOTE — ED Notes (Signed)
Was seen here from Dr Verdis Frederickson office to be evaluated for chest pain and diabetes. Pt does not speak. Drove himself here.

## 2011-03-19 NOTE — Progress Notes (Signed)
OFFICE NOTE  03/19/2011  CC:  Chief Complaint  Patient presents with  . Follow-up    DM     HPI: Patient is a 61 y.o. Asian male who is here with his interpreter for DM 2 f/u. Started lantus recently, up to 17 U qhs now, glucoses 115 avg fasting. 2H PP's much higher (270-290).  His bloating/GI upset has gone away since stopping metformin and victoza. His constipation has resolved since getting on OTC senakot S.  At end of visit his interpreter brings up that he has been having chest pain the last 2d.  Describes mild substernal pressure and pain, seems to be worse when he attempts to walk briskly.  No SOB, no diaphoresis, no nausea, no palpitations, no fevers or cough. Denies any recent leg pain or swelling, denies hx of DVT/PE.  Pertinent PMH:  Past Medical History  Diagnosis Date  . Diabetes mellitus 2012    "sugar a little bit up but not diabetes" is how the patient stated it (no records available).  HbA1c here in fall 2012 was UP.  Marland Kitchen Hemorrhagic, fever 1993    No residual effects  . Coronary artery disease 2011    Stents x3, put on plavix and ASA--with further questioning he apparently had no symptoms but he told an MD in [place] that he had some SOB at times and the MD put the stents in as a PROPHYLAXIS against CAD (pt says actual ischemia or CAD was NOT confirmed--no records available.  I encouraged patient to go ahead and d/c plavix after he had been on it for one year but he insists on continuing it.  . Tobacco dependence     1/2 ppd x 40 yrs   Past Surgical History  Procedure Date  . Appendectomy age 63  . Esophagogastroduodenoscopy     Done for upper abd pains: normal per pt report.   Past family and social history reviewed and there are no changes since the patient's last office visit with me.  MEDS:  Outpatient Prescriptions Prior to Visit  Medication Sig Dispense Refill  . aspirin 81 MG tablet Take 81 mg by mouth daily.        . clopidogrel (PLAVIX) 75 MG tablet  Take 75 mg by mouth daily.        Marland Kitchen glucose blood (FREESTYLE LITE) test strip Check glucose twice per day  100 each  5  . insulin glargine (LANTUS) 100 UNIT/ML injection Inject 17 Units into the skin at bedtime.       . Insulin Pen Needle (B-D UF III MINI PEN NEEDLES) 31G X 5 MM MISC Use as directed for insulin injections  30 each  2  . Lancets (FREESTYLE) lancets Check glucose twice daily  100 each  5    PE: Blood pressure 106/71, pulse 70, height 5' 8.5" (1.74 m), weight 188 lb (85.276 kg). Gen: Alert, well appearing, smiling and pleasant.  Patient is oriented to person, place, time, and situation. ENT: nose clear, throat without erythema or exudate Neck - No masses or thyromegaly or limitation in range of motion CV: RRR, no m/r/g.   LUNGS: CTA bilat, nonlabored resps, good aeration in all lung fields. EXT: no clubbing, cyanosis, or edema.   NO focal cord, no tenderness, no erythema. Sensation intact bilaterally.   LAB: 12 lead EKG shows NSR, with TWI in V4-V6, no prior EKG for comparison.  IMPRESSION AND PLAN:  1) DM 2, insulin-requiring.  Control improving.  Increase lantus to 18  U qhs, add novolog 5 U qAC and titrate slowly to get 2H PP in the 140-170 range. Gave sample of novolog flexpen today plus sent in Rx.  2) Chest Pain, possible ACS, needs to go to local ED to get further e/m given his risk factors and subtle EKG changes today. This was fully explained to his interpreter today and the patient agreed.  We called [facility] to let them know that he may need a mandarin chinese interpreter b/c his current interpreter was not sure if she could go with him.  FOLLOW UP: 2 wks in office for f/u DM 2.

## 2011-03-20 ENCOUNTER — Other Ambulatory Visit: Payer: Self-pay | Admitting: Family Medicine

## 2011-03-20 ENCOUNTER — Ambulatory Visit (INDEPENDENT_AMBULATORY_CARE_PROVIDER_SITE_OTHER): Payer: PRIVATE HEALTH INSURANCE | Admitting: Family Medicine

## 2011-03-20 ENCOUNTER — Encounter: Payer: Self-pay | Admitting: Family Medicine

## 2011-03-20 DIAGNOSIS — I2510 Atherosclerotic heart disease of native coronary artery without angina pectoris: Secondary | ICD-10-CM

## 2011-03-20 DIAGNOSIS — R079 Chest pain, unspecified: Secondary | ICD-10-CM

## 2011-03-20 NOTE — Progress Notes (Signed)
OFFICE NOTE  03/20/2011  CC:  Chief Complaint  Patient presents with  . Follow-up    ER     HPI: Patient is a 61 y.o. [place] male who is here for f/u chest pain from yesterday's visit.   He went to ED and eval there was ok but ED MD recommended he be admitted for obs and pt declined, was sent home with some nitroglycerin tabs.  ED records reviewed today: cardiac enzymes x 1, CBC, CMET all normal except hyperglycemia.  CXR normal.   EKG there showed NSTW changes as it did here earlier in the same day. Pt says that as soon as he took the 4 baby ASA in the ED his CP resolved and he has not had it recur since then.  NO chest pain currently.  Denies any significant hx of heartburn/GERD.   Pertinent PMH:  Past Medical History  Diagnosis Date  . Diabetes mellitus 2012    "sugar a little bit up but not diabetes" is how the patient stated it (no records available).  HbA1c here in fall 2012 was UP.  Marland Kitchen Hemorrhagic, fever 1993    No residual effects  . Coronary artery disease 2011    Stents x3, put on plavix and ASA--with further questioning he apparently had no symptoms but he told an MD in [place] that he had some SOB at times and the MD put the stents in as a PROPHYLAXIS against CAD (pt says actual ischemia or CAD was NOT confirmed--no records available.  I encouraged patient to go ahead and d/c plavix after he had been on it for one year but he insists on continuing it.  . Tobacco dependence     1/2 ppd x 40 yrs   Past surgical, social, and family history reviewed and no changes noted since last office visit.  MEDS:  Outpatient Prescriptions Prior to Visit  Medication Sig Dispense Refill  . aspirin 81 MG tablet Take 81 mg by mouth daily.        . clopidogrel (PLAVIX) 75 MG tablet Take 75 mg by mouth daily.        Marland Kitchen glucose blood (FREESTYLE LITE) test strip Check glucose twice per day  100 each  5  . insulin aspart (NOVOLOG FLEXPEN) 100 UNIT/ML injection Inject 5 Units into the skin 3  (three) times daily before meals.  1 vial  12  . insulin glargine (LANTUS) 100 UNIT/ML injection Inject 17 Units into the skin at bedtime.       . Insulin Pen Needle (B-D UF III MINI PEN NEEDLES) 31G X 5 MM MISC Use as directed for insulin injections  30 each  2  . Lancets (FREESTYLE) lancets Check glucose twice daily  100 each  5  . nitroGLYCERIN (NITROSTAT) 0.4 MG SL tablet Place 1 tablet (0.4 mg total) under the tongue every 5 (five) minutes as needed for chest pain.  30 tablet  0   Facility-Administered Medications Prior to Visit  Medication Dose Route Frequency Provider Last Rate Last Dose  . aspirin chewable tablet 324 mg  324 mg Oral Once Carleene Cooper III, MD   324 mg at 03/19/11 1625  . 0.9 %  sodium chloride infusion   Intravenous Continuous Carleene Cooper III, MD        PE: Blood pressure 123/78, pulse 67, weight 189 lb (85.73 kg), SpO2 98.00%. Gen: Alert, well appearing, smiling.  Patient is oriented to person, place, time, and situation. CV: RRR, no m/r/g.  LUNGS: CTA bilat, nonlabored resps, good aeration in all lung fields.  IMPRESSION AND PLAN: Chest pain, resolved. Minimal r/o ACS w/u done in ED yesterday and then pt declined to be admitted to hospital for further obs and cardiology consult. I have ordered a cardiology referral to be done as an outpt. I told him to fill the rx for sl nitro he was given by the EDP yesterday and take it as directed for future chest pain. If not improved by this med then he is to call 911 or go to nearest ED.  FOLLOW UP:  2 wks (appt already set up)

## 2011-03-28 ENCOUNTER — Ambulatory Visit: Payer: PRIVATE HEALTH INSURANCE | Admitting: Cardiovascular Disease

## 2011-04-02 ENCOUNTER — Encounter: Payer: Self-pay | Admitting: Family Medicine

## 2011-04-02 ENCOUNTER — Ambulatory Visit (INDEPENDENT_AMBULATORY_CARE_PROVIDER_SITE_OTHER): Payer: PRIVATE HEALTH INSURANCE | Admitting: Family Medicine

## 2011-04-02 VITALS — BP 111/73 | HR 76 | Temp 98.2°F | Ht 68.5 in | Wt 189.0 lb

## 2011-04-02 DIAGNOSIS — B353 Tinea pedis: Secondary | ICD-10-CM

## 2011-04-02 DIAGNOSIS — F17200 Nicotine dependence, unspecified, uncomplicated: Secondary | ICD-10-CM

## 2011-04-02 DIAGNOSIS — R079 Chest pain, unspecified: Secondary | ICD-10-CM

## 2011-04-02 DIAGNOSIS — E119 Type 2 diabetes mellitus without complications: Secondary | ICD-10-CM

## 2011-04-02 MED ORDER — INSULIN GLARGINE 100 UNIT/ML ~~LOC~~ SOLN: 18.0000 [IU] | Freq: Every day | SUBCUTANEOUS | Status: DC

## 2011-04-02 MED ORDER — INSULIN ASPART 100 UNIT/ML ~~LOC~~ SOLN: 12.0000 [IU] | Freq: Three times a day (TID) | SUBCUTANEOUS | Status: DC

## 2011-04-02 NOTE — Progress Notes (Signed)
OFFICE NOTE  04/02/2011  CC:  Chief Complaint  Patient presents with  . Follow-up    diabetes, chest pain     HPI: Patient is a 61 y.o. [place] male who is here for f/u DM 2, tob dependence, hx of chest pain. Feeling good, gluc 118 avg fasting and 180 avg 2H PP.  Taking 16 U lantus qhs and 10 U novolog each meal.  No hypoglycemia.  Denies feet pain, tingling, or numbness.  No itching of feet.  Has had some mild recurrences of his substernal CP when walking fast or jogging, says he will slow down his walking or jogging and after a couple of minutes his CP is gon and he resumes previous level of exercise without problem.   No diaphoresis, no nausea, no arm pain, no dizziness. He has not filled the rx for nitro that the EDP had given him a couple of weeks ago. His cardiology initial consult had to be rescheduled for 04/09/11 due to weather.  He is still smoking and says he has not been considering quitting.  Pertinent PMH:  Past Medical History  Diagnosis Date  . Diabetes mellitus 2012    "sugar a little bit up but not diabetes" is how the patient stated it (no records available).  HbA1c here in fall 2012 was UP.  Marland Kitchen Hemorrhagic, fever 1993    No residual effects  . Coronary artery disease 2011    Stents x3, put on plavix and ASA--with further questioning he apparently had no symptoms but he told an MD in [place] that he had some SOB at times and the MD put the stents in as a PROPHYLAXIS against CAD (pt says actual ischemia or CAD was NOT confirmed--no records available.  I encouraged patient to go ahead and d/c plavix after he had been on it for one year but he insists on continuing it.  . Tobacco dependence     1/2 ppd x 40 yrs   Past surgical, social, and family history reviewed and no changes noted since last office visit.  MEDS:  Outpatient Prescriptions Prior to Visit  Medication Sig Dispense Refill  . aspirin 81 MG tablet Take 81 mg by mouth daily.        . clopidogrel (PLAVIX) 75  MG tablet Take 75 mg by mouth daily.        Marland Kitchen glucose blood (FREESTYLE LITE) test strip Check glucose twice per day  100 each  5  . insulin glargine (LANTUS) 100 UNIT/ML injection Inject 17 Units into the skin at bedtime.       . Insulin Pen Needle (B-D UF III MINI PEN NEEDLES) 31G X 5 MM MISC Use as directed for insulin injections  30 each  2  . Lancets (FREESTYLE) lancets Check glucose twice daily  100 each  5  . nitroGLYCERIN (NITROSTAT) 0.4 MG SL tablet Place 1 tablet (0.4 mg total) under the tongue every 5 (five) minutes as needed for chest pain.  30 tablet  0  . insulin aspart (NOVOLOG FLEXPEN) 100 UNIT/ML injection Inject 5 Units into the skin 3 (three) times daily before meals.  1 vial  12    PE: Blood pressure 111/73, pulse 76, temperature 98.2 F (36.8 C), temperature source Temporal, height 5' 8.5" (1.74 m), weight 189 lb (85.73 kg). Gen: Alert, well appearing.  Patient is oriented to person, place, time, and situation. FEET:  Pinkish hyperkeratotic skin in moccasin distribution on both plantar surfaces.  No calluses or ulcerations.  Monofilament testing reveals intact sensation bilat.  DP and PT pulses 2+ bilat.  Ankle and toes ROM normal.  No edema.  IMPRESSION AND PLAN: DM 2, insulin-requiring. Normal diabetic foot exam today. Refer for initial diabetic retinopathy screening exam. Increase lantus to 18 U qhs, and increase novolog to 12 U at each meal. Next HbA1c due after 04/11/11.  Chest pain, sounds like angina and he has definite RF's for CAD.  Does not sound unstable.   He has cardiology eval set for next week.  FOLLOW UP: 2 wks (per pt preference)

## 2011-04-02 NOTE — Patient Instructions (Signed)
Buy lamisil cream over the counter and apply to feet/toes twice daily for at least 6 weeks.

## 2011-04-04 HISTORY — PX: NM MYOVIEW LTD: HXRAD82

## 2011-04-08 ENCOUNTER — Encounter: Payer: Self-pay | Admitting: Family Medicine

## 2011-04-09 ENCOUNTER — Encounter: Payer: Self-pay | Admitting: Cardiovascular Disease

## 2011-04-09 ENCOUNTER — Ambulatory Visit (INDEPENDENT_AMBULATORY_CARE_PROVIDER_SITE_OTHER): Payer: PRIVATE HEALTH INSURANCE | Admitting: Cardiovascular Disease

## 2011-04-09 DIAGNOSIS — E119 Type 2 diabetes mellitus without complications: Secondary | ICD-10-CM

## 2011-04-09 DIAGNOSIS — R079 Chest pain, unspecified: Secondary | ICD-10-CM

## 2011-04-09 DIAGNOSIS — F17200 Nicotine dependence, unspecified, uncomplicated: Secondary | ICD-10-CM

## 2011-04-09 NOTE — Assessment & Plan Note (Signed)
Counseled for less than 10 minutes.  Interpretor had hard tome explaining.  F/U primary

## 2011-04-09 NOTE — Patient Instructions (Signed)
Your physician has requested that you have an exercise stress myoview, ASAP. For further information please visit [email or web address] Please follow instruction sheet, as given.  Your physician recommends that you schedule a follow-up appointment with Dr. Eden Emms soon after [facility].

## 2011-04-09 NOTE — Assessment & Plan Note (Signed)
Discussed low carb diet.  Target hemoglobin A1c is 6.5 or less.  Continue current medications.  

## 2011-04-09 NOTE — Assessment & Plan Note (Signed)
Stress myovue  Continue ASA and Plavix.  Nitro to be picked up.  Low thresshold to cath

## 2011-04-09 NOTE — Progress Notes (Signed)
Patient ID: Lawrence Randall, male   DOB: [birth year], 61 y.o.   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  Had "3 prophylactic stents placed in [place] 1.5 years ago.  At that time had no chest pain.  Now has some exertional SSCP.  Can walk through the pain and continue.  Has not picked up his nitro.  Pain is not resting.  No pleurisy.  No gi overtones.  Compliant with meds.  Discussed through interpretor options of direct cath vs stress test.  Patient prefers stress test.  Encourgaged him to pick up nitro and use it if necessary.  Will arrange myovue tomorrow and have low thresshold to proceed with cath.  ROS: Denies fever, malais, weight loss, blurry vision, decreased visual acuity, cough, sputum, SOB, hemoptysis, pleuritic pain, palpitaitons, heartburn, abdominal pain, melena, lower extremity edema, claudication, or rash.  All other systems reviewed and negative   General: Affect appropriate Healthy:  appears stated age HEENT: normal Neck supple with no adenopathy JVP normal no bruits no thyromegaly Lungs clear with no wheezing and good diaphragmatic motion Heart:  S1/S2 no murmur,rub, gallop or click PMI normal Abdomen: benighn, BS positve, no tenderness, no AAA no bruit.  No HSM or HJR Distal pulses intact with no bruits No edema Neuro non-focal Skin warm and dry No muscular weakness  Medications Current Outpatient Prescriptions  Medication Sig Dispense Refill  . aspirin 81 MG tablet Take 81 mg by mouth daily.        . clopidogrel (PLAVIX) 75 MG tablet Take 75 mg by mouth daily.        Marland Kitchen glucose blood (FREESTYLE LITE) test strip Check glucose twice per day  100 each  5  . insulin aspart (NOVOLOG) 100 UNIT/ML injection Inject 12 Units into the skin 3 (three) times daily before meals.  1 pen  5  . insulin glargine (LANTUS) 100 UNIT/ML injection Inject 18 Units into the skin at bedtime.  5 pen  5  . Insulin Pen Needle (B-D UF III MINI PEN NEEDLES) 31G X 5 MM MISC Use  as directed for insulin injections  30 each  2  . Lancets (FREESTYLE) lancets Check glucose twice daily  100 each  5  . nitroGLYCERIN (NITROSTAT) 0.4 MG SL tablet Place 1 tablet (0.4 mg total) under the tongue every 5 (five) minutes as needed for chest pain.  30 tablet  0    Allergies Amoxil  Family History: Family History  Problem Relation Age of Onset  . Heart disease Mother     chf  . Diabetes Mother     Social History: History   Social History  . Marital Status: Married    Spouse Name: N/A    Number of Children: N/A  . Years of Education: N/A   Occupational History  . Not on file.   Social History Main Topics  . Smoking status: Current Everyday Smoker -- 0.5 packs/day for 40 years    Types: Cigarettes  . Smokeless tobacco: Never Used  . Alcohol Use: Not on file  . Drug Use: Not on file  . Sexually Active: Not on file   Other Topics Concern  . 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  Occasional beer.  No drug use.Exercise: fast walking most days about 2mi.Eats sensible diet.  Electrocardiogram:   NSR normal ECG 03/19/11  Assessment and Plan

## 2011-04-10 ENCOUNTER — Ambulatory Visit (HOSPITAL_COMMUNITY): Payer: PRIVATE HEALTH INSURANCE | Attending: Cardiovascular Disease | Admitting: Radiology

## 2011-04-10 DIAGNOSIS — E119 Type 2 diabetes mellitus without complications: Secondary | ICD-10-CM | POA: Insufficient documentation

## 2011-04-10 DIAGNOSIS — Z794 Long term (current) use of insulin: Secondary | ICD-10-CM | POA: Insufficient documentation

## 2011-04-10 DIAGNOSIS — R079 Chest pain, unspecified: Secondary | ICD-10-CM

## 2011-04-10 DIAGNOSIS — E785 Hyperlipidemia, unspecified: Secondary | ICD-10-CM | POA: Insufficient documentation

## 2011-04-10 DIAGNOSIS — I2510 Atherosclerotic heart disease of native coronary artery without angina pectoris: Secondary | ICD-10-CM

## 2011-04-10 DIAGNOSIS — F17200 Nicotine dependence, unspecified, uncomplicated: Secondary | ICD-10-CM | POA: Insufficient documentation

## 2011-04-10 DIAGNOSIS — Z8249 Family history of ischemic heart disease and other diseases of the circulatory system: Secondary | ICD-10-CM | POA: Insufficient documentation

## 2011-04-10 MED ORDER — TECHNETIUM TC 99M TETROFOSMIN IV KIT
30.0000 | PACK | Freq: Once | INTRAVENOUS | Status: AC | PRN
  Administered 2011-04-10: 30 via INTRAVENOUS

## 2011-04-10 MED ORDER — TECHNETIUM TC 99M TETROFOSMIN IV KIT
10.0000 | PACK | Freq: Once | INTRAVENOUS | Status: AC | PRN
  Administered 2011-04-10: 10 via INTRAVENOUS

## 2011-04-10 NOTE — Progress Notes (Signed)
[facility] SITE 3 NUCLEAR MED [address] [place] [address] [phone]  Cardiology Nuclear Med Study  Lawrence Randall is a 61 y.o. male [id number] Feb 10, 1951   Nuclear Med Background Indication for Stress Test:  Evaluation for Ischemia History: 11/2 yrs ago Stents x3 [place] Cardiac Risk Factors: Family History - CAD, IDDM Type 2, Lipids and Smoker  Symptoms:  Chest Pain   Nuclear Pre-Procedure Caffeine/Decaff Intake:  None NPO After: 8:00am   Lungs:  clear IV 0.9% NS with Angio Cath:  22g  IV Site: R Hand X 1, Tolerated well IV Started by:  Irean Hong, RN  Chest Size (in):  42 Cup Size: n/a  Height: 5\' 8"  (1.727 m)  Weight:  187 lb (84.823 kg)  BMI:  Body mass index is 28.43 kg/(m^2). Tech Comments:  1/2 dose lantus insulin last night, no insulin today, CBG 5hr pc at 1:10pm was 170 Patsy Edwards,RN. [place] interpreter present    Nuclear Med Study 1 or 2 day study: 1 day  Stress Test Type:  Stress  Reading MD: Charlton Haws, MD  Order Authorizing Provider:  Charlton Haws, MD  Resting Radionuclide: Technetium 73m Tetrofosmin  Resting Radionuclide Dose: 11.0 mCi   Stress Radionuclide:  Technetium 99m Tetrofosmin  Stress Radionuclide Dose: 33.0 mCi           Stress Protocol Rest HR: 58 Stress HR: 148  Rest BP: 146/87 Stress BP: 205/61  Exercise Time (min): 10:10 METS: 12.0   Predicted Max HR: 160 bpm % Max HR: 92.5 bpm Rate Pressure Product: 44010   Dose of Adenosine (mg):  n/a Dose of Lexiscan: n/a mg  Dose of Atropine (mg): n/a Dose of Dobutamine: n/a mcg/kg/min (at max HR)  Stress Test Technologist: Milana Na, EMT-P  Nuclear Technologist:  Doyne Keel, CNMT     Rest Procedure:  Myocardial perfusion imaging was performed at rest 45 minutes following the intravenous administration of Technetium 28m Tetrofosmin. Rest ECG: Sinus Bradycardia  Stress Procedure:  The patient exercised for 10:10.  The patient stopped due to fatigue and denied  any chest pain.  There were non specific ST-T wave changes.  Technetium 75m Tetrofosmin was injected at peak exercise and myocardial perfusion imaging was performed after a brief delay. Stress ECG: No significant change from baseline ECG  QPS Raw Data Images:  Normal; no motion artifact; normal heart/lung ratio. Stress Images:  Normal homogeneous uptake in all areas of the myocardium. Rest Images:  Normal homogeneous uptake in all areas of the myocardium. Subtraction (SDS):  Normal Transient Ischemic Dilatation (Normal <1.22):  0.82 Lung/Heart Ratio (Normal <0.45):  0.26  Quantitative Gated Spect Images QGS EDV:  73 ml QGS ESV:  25 ml QGS cine images:  NL LV Function; NL Wall Motion QGS EF: 66%  Impression Exercise Capacity:  Excellent exercise capacity. BP Response:  Hypertensive blood pressure response. Clinical Symptoms:  Mild chest pain/dyspnea. ECG Impression:  No significant ST segment change suggestive of ischemia. Comparison with Prior Nuclear Study: No images to compare  Overall Impression:  Normal stress nuclear study.  Hypertensive response to exercise   Charlton Haws

## 2011-04-14 ENCOUNTER — Telehealth: Payer: Self-pay | Admitting: *Deleted

## 2011-04-14 MED ORDER — LOSARTAN POTASSIUM 25 MG PO TABS: 25.0000 mg | ORAL_TABLET | Freq: Every day | ORAL | Status: DC

## 2011-04-14 NOTE — Telephone Encounter (Signed)
SPOKE WITH PT'S DAUGHTER  RE MESSAGE FROM DR Eden Emms PT TO START ON LOSARTAN 25 MG 1 EVERY DAY  NORMAL  MYOVIEW AND HAS APPT ON  04-29-11 AT 9:15 .Zack Seal

## 2011-04-16 ENCOUNTER — Ambulatory Visit (INDEPENDENT_AMBULATORY_CARE_PROVIDER_SITE_OTHER): Payer: PRIVATE HEALTH INSURANCE | Admitting: Family Medicine

## 2011-04-16 ENCOUNTER — Encounter: Payer: Self-pay | Admitting: Family Medicine

## 2011-04-16 VITALS — BP 124/80 | HR 64 | Temp 97.6°F | Ht 68.5 in | Wt 190.0 lb

## 2011-04-16 DIAGNOSIS — K219 Gastro-esophageal reflux disease without esophagitis: Secondary | ICD-10-CM | POA: Insufficient documentation

## 2011-04-16 DIAGNOSIS — F17200 Nicotine dependence, unspecified, uncomplicated: Secondary | ICD-10-CM

## 2011-04-16 DIAGNOSIS — E119 Type 2 diabetes mellitus without complications: Secondary | ICD-10-CM

## 2011-04-16 DIAGNOSIS — R079 Chest pain, unspecified: Secondary | ICD-10-CM

## 2011-04-16 MED ORDER — OMEPRAZOLE 40 MG PO CPDR: 40.0000 mg | DELAYED_RELEASE_CAPSULE | Freq: Every day | ORAL | Status: DC

## 2011-04-16 NOTE — Progress Notes (Signed)
OFFICE VISIT  04/16/2011   CC:  Chief Complaint  Patient presents with  . Follow-up    DM, chest pain     HPI:    Patient is a 61 y.o. [place] male who presents for routine DM 2 f/u. Also, nuclear stress testing by Dr. Eden Emms recently showed no ischemia but did show hypertensive response to exercise.  Still has SS chest pains when starting exercise and it resolves after he slows down a bit.  He is then able to resume full exercise level.  Also the same symptom occurs sometimes after overeating.  Denies typical heartburn or regurg into throat/mouth. Glucose monitoring: fastings 120s "lately", and 2H PP 230s "lately". He has done no self titration of his insulins. Continues to smoke, won't consider quitting cold [place]. He's resistant to making any lifestyle changes to help his conditions.    Past Medical History  Diagnosis Date  . Diabetes mellitus 2012    "sugar a little bit up but not diabetes" is how the patient stated it (no records available).  HbA1c here in fall 2012 was UP.  Marland Kitchen Hemorrhagic, fever 1993    No residual effects  . Coronary artery disease 2011    Stents x3, put on plavix and ASA--with further questioning he apparently had no symptoms but he told an MD in [place] that he had some SOB at times and the MD put the stents in as a PROPHYLAXIS against CAD (pt says actual ischemia or CAD was NOT confirmed--no records available.  I encouraged patient to go ahead and d/c plavix after he had been on it for one year but he insists on continuing it.  . Tobacco dependence     1/2 ppd x 40 yrs    Past Surgical History  Procedure Date  . Appendectomy age 61  . Esophagogastroduodenoscopy     Done for upper abd pains: normal per pt report.    Outpatient Prescriptions Prior to Visit  Medication Sig Dispense Refill  . aspirin 81 MG tablet Take 81 mg by mouth daily.        . clopidogrel (PLAVIX) 75 MG tablet Take 75 mg by mouth daily.        Marland Kitchen glucose blood (FREESTYLE LITE)  test strip Check glucose twice per day  100 each  5  . insulin aspart (NOVOLOG) 100 UNIT/ML injection Inject 12 Units into the skin 3 (three) times daily before meals.  1 pen  5  . insulin glargine (LANTUS) 100 UNIT/ML injection Inject 18 Units into the skin at bedtime.  5 pen  5  . Insulin Pen Needle (B-D UF III MINI PEN NEEDLES) 31G X 5 MM MISC Use as directed for insulin injections  30 each  2  . Lancets (FREESTYLE) lancets Check glucose twice daily  100 each  5  . losartan (COZAAR) 25 MG tablet Take 1 tablet (25 mg total) by mouth daily.  30 tablet  11  . nitroGLYCERIN (NITROSTAT) 0.4 MG SL tablet Place 1 tablet (0.4 mg total) under the tongue every 5 (five) minutes as needed for chest pain.  30 tablet  0    Allergies  Allergen Reactions  . Amoxil Itching and Rash    swollen hands    ROS As per HPI  PE: Blood pressure 124/80, pulse 64, temperature 97.6 F (36.4 C), temperature source Temporal, height 5' 8.5" (1.74 m), weight 190 lb (86.183 kg). Gen: Alert, well appearing.  Patient is oriented to person, place, time, and situation. No  further exam today.  LABS:  None today  IMPRESSION AND PLAN:  Type II or unspecified type diabetes mellitus without mention of complication, uncontrolled Poor postprandial control, but fasting control improving some. HbA1c today. Increase lantus to 20 U qhs, increase mealtime insulin to 14 U each meal.  Discussed titration of mealtime insulin: increase by 2 U q3d until 2H PP glucose in the range of 140-170 consistently. He says he does not want to go to nutritionist b/c he doesn't want to limit his diet any, but we talked him into going simply for the dietary education--even if he chooses not to follow the recommendations.  Tobacco dependence Strongly encouraged cessation today.  Tried to make it clear that this is impacting his health almost as much as DM. He has agreed to start cutting back--he'll try smoking 2 less cigs per day until next  visit.  Chest pain Noncardiac.   However, he is at high risk plus has stents in place so he SHOULD still get nitroglycerin rx picked up to use prn unrelenting exertional chest pain/pressure.  GERD (gastroesophageal reflux disease) Start prilosec 40mg  qd (generic). Discussed dietary info, gave handout as well.     FOLLOW UP: Return in about 2 weeks (around 04/30/2011) for f/u DM 2, tob dep, chest pain, GERD.

## 2011-04-16 NOTE — Patient Instructions (Signed)
Diet for GERD or PUD Nutrition therapy can help ease the discomfort of gastroesophageal reflux disease (GERD) and peptic ulcer disease (PUD).  HOME CARE INSTRUCTIONS   Eat your meals slowly, in a relaxed setting.   Eat 5 to 6 small meals per day.   If a food causes distress, stop eating it for a period of time.  FOODS TO AVOID  Coffee, regular or decaffeinated.   Cola beverages, regular or low calorie.   Tea, regular or decaffeinated.   Pepper.   Cocoa.   High fat foods, including meats.   Butter, margarine, hydrogenated oil (trans fats).   Peppermint or spearmint (if you have GERD).   Fruits and vegetables if not tolerated.   Alcohol.   Nicotine (smoking or chewing). This is one of the most potent stimulants to acid production in the gastrointestinal tract.   Any food that seems to aggravate your condition.  If you have questions regarding your diet, ask your caregiver or a registered dietitian. TIPS  Lying flat may make symptoms worse. Keep the head of your bed raised 6 to 9 inches (15 to 23 cm) by using a foam wedge or blocks under the legs of the bed.   Do not lay down until 3 hours after eating a meal.   Daily physical activity may help reduce symptoms.  MAKE SURE YOU:   Understand these instructions.   Will watch your condition.   Will get help right away if you are not doing well or get worse.  Document Released: 02/17/2005 Document Revised: 10/30/2010 Document Reviewed: 07/03/2008 Exit[employer] Patient Information 2012 [employer]. 

## 2011-04-16 NOTE — Assessment & Plan Note (Signed)
Strongly encouraged cessation today.  Tried to make it clear that this is impacting his health almost as much as DM. He has agreed to start cutting back--he'll try smoking 2 less cigs per day until next visit.

## 2011-04-16 NOTE — Progress Notes (Signed)
Addended by: Andrew Au on: 04/16/2011 11:46 AM   Modules accepted: Orders

## 2011-04-16 NOTE — Assessment & Plan Note (Signed)
Noncardiac.   However, he is at high risk plus has stents in place so he SHOULD still get nitroglycerin rx picked up to use prn unrelenting exertional chest pain/pressure.

## 2011-04-16 NOTE — Assessment & Plan Note (Signed)
Start prilosec 40mg  qd (generic). Discussed dietary info, gave handout as well.

## 2011-04-16 NOTE — Assessment & Plan Note (Signed)
Poor postprandial control, but fasting control improving some. HbA1c today. Increase lantus to 20 U qhs, increase mealtime insulin to 14 U each meal.  Discussed titration of mealtime insulin: increase by 2 U q3d until 2H PP glucose in the range of 140-170 consistently. He says he does not want to go to nutritionist b/c he doesn't want to limit his diet any, but we talked him into going simply for the dietary education--even if he chooses not to follow the recommendations.

## 2011-04-29 ENCOUNTER — Encounter: Payer: Self-pay | Admitting: Cardiovascular Disease

## 2011-04-29 ENCOUNTER — Ambulatory Visit (INDEPENDENT_AMBULATORY_CARE_PROVIDER_SITE_OTHER): Payer: PRIVATE HEALTH INSURANCE | Admitting: Cardiovascular Disease

## 2011-04-29 DIAGNOSIS — I10 Essential (primary) hypertension: Secondary | ICD-10-CM

## 2011-04-29 DIAGNOSIS — E119 Type 2 diabetes mellitus without complications: Secondary | ICD-10-CM

## 2011-04-29 MED ORDER — LOSARTAN POTASSIUM 25 MG PO TABS: 25.0000 mg | ORAL_TABLET | Freq: Every day | ORAL | Status: DC

## 2011-04-29 MED ORDER — LOSARTAN POTASSIUM 50 MG PO TABS: 50.0000 mg | ORAL_TABLET | Freq: Every day | ORAL | Status: DC

## 2011-04-29 MED ORDER — ATORVASTATIN CALCIUM 20 MG PO TABS: 20.0000 mg | ORAL_TABLET | Freq: Every day | ORAL | Status: DC

## 2011-04-29 NOTE — Patient Instructions (Addendum)
Your physician has recommended you make the following change in your medication: Start taking your Cozaar (Losartan) 50mg  daily and start taking Lipitor (atorvastatin) 20mg  at bedtime.  Your physician wants you to follow-up in: 6 months.   You will receive a reminder letter in the mail two months in advance. If you don't receive a letter, please call our office to schedule the follow-up appointment.  Your physician recommends that you return for lab work in: 3 months, fasting (nothing to eat or drink after midnight). (CMP, A1C, Lipid)- Please call for an appt when you know the interpreter's schedule.

## 2011-04-29 NOTE — Assessment & Plan Note (Signed)
Myovue is normal.  NO cath for now.  Rx BP with ARB  Has not been taking cozaar.  Add back statin which he also has not been taking.  Stents and DM should be on one.  F/U labs in 3 months.

## 2011-04-29 NOTE — Progress Notes (Signed)
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 Had "3 prophylactic stents placed in [place] 1.5 years ago. At that time had no chest pain. Now has some exertional SSCP. Can walk through the pain and continue. Has not picked up his nitro. Pain is not resting. No pleurisy. No gi overtones. Compliant with meds. Discussed through interpretor options of direct cath vs stress test. Patient prefers stress test. Encourgaged him to pick up nitro and use it if necessary. Will arrange myovue tomorrow and have low thresshold to proceed with cath.  Reviewed myovue from 04/10/11   Exercised for 10:10 with no ischemia and nornmal EF  BP 205/61  He has not been taking ARB or statin since leaving [place]  ROS: Denies fever, malais, weight loss, blurry vision, decreased visual acuity, cough, sputum, SOB, hemoptysis, pleuritic pain, palpitaitons, heartburn, abdominal pain, melena, lower extremity edema, claudication, or rash.  All other systems reviewed and negative  General: Affect appropriate Healthy:  appears stated age HEENT: normal Neck supple with no adenopathy JVP normal no bruits no thyromegaly Lungs clear with no wheezing and good diaphragmatic motion Heart:  S1/S2 no murmur, no rub, gallop or click PMI normal Abdomen: benighn, BS positve, no tenderness, no AAA no bruit.  No HSM or HJR Distal pulses intact with no bruits No edema Neuro non-focal Skin warm and dry No muscular weakness   Current Outpatient Prescriptions  Medication Sig Dispense Refill  . aspirin 81 MG tablet Take 81 mg by mouth daily.        . clopidogrel (PLAVIX) 75 MG tablet Take 75 mg by mouth daily.        Marland Kitchen glucose blood (FREESTYLE LITE) test strip Check glucose twice per day  100 each  5  . insulin aspart (NOVOLOG) 100 UNIT/ML injection Inject 12 Units into the skin 3 (three) times daily before meals.  1 pen  5  . insulin glargine (LANTUS) 100 UNIT/ML injection Inject 18 Units into the skin at bedtime.  5 pen  5  .  Insulin Pen Needle (B-D UF III MINI PEN NEEDLES) 31G X 5 MM MISC Use as directed for insulin injections  30 each  2  . Lancets (FREESTYLE) lancets Check glucose twice daily  100 each  5  . nitroGLYCERIN (NITROSTAT) 0.4 MG SL tablet Place 1 tablet (0.4 mg total) under the tongue every 5 (five) minutes as needed for chest pain.  30 tablet  0  . losartan (COZAAR) 25 MG tablet Take 1 tablet (25 mg total) by mouth daily.  30 tablet  11  . omeprazole (PRILOSEC) 40 MG capsule Take 1 capsule (40 mg total) by mouth daily.  30 capsule  3    Allergies  Amoxil  Electrocardiogram:  Assessment and Plan

## 2011-04-29 NOTE — Assessment & Plan Note (Signed)
Discussed low carb diet.  Target hemoglobin A1c is 6.5 or less.  Continue current medications.  

## 2011-05-07 ENCOUNTER — Ambulatory Visit: Payer: PRIVATE HEALTH INSURANCE | Admitting: Family Medicine

## 2011-05-12 ENCOUNTER — Encounter: Payer: Self-pay | Admitting: Family Medicine

## 2011-05-12 ENCOUNTER — Ambulatory Visit (INDEPENDENT_AMBULATORY_CARE_PROVIDER_SITE_OTHER): Payer: PRIVATE HEALTH INSURANCE | Admitting: Family Medicine

## 2011-05-12 DIAGNOSIS — K219 Gastro-esophageal reflux disease without esophagitis: Secondary | ICD-10-CM

## 2011-05-12 DIAGNOSIS — F17200 Nicotine dependence, unspecified, uncomplicated: Secondary | ICD-10-CM

## 2011-05-12 MED ORDER — INSULIN ASPART 100 UNIT/ML ~~LOC~~ SOLN: 14.0000 [IU] | Freq: Three times a day (TID) | SUBCUTANEOUS | Status: DC

## 2011-05-12 MED ORDER — INSULIN GLARGINE 100 UNIT/ML ~~LOC~~ SOLN: 20.0000 [IU] | Freq: Every day | SUBCUTANEOUS | Status: DC

## 2011-05-12 NOTE — Assessment & Plan Note (Signed)
Improved/cutting back! Encouraged pt to continue to do so.

## 2011-05-12 NOTE — Progress Notes (Signed)
OFFICE VISIT  05/12/2011   CC:  Chief Complaint  Patient presents with  . Follow-up    DM 2, chest pain, GERD, tob dep     HPI:    Patient is a 61 y.o. [place] male who presents for 1 mo routine DM 2, tob dependence, GERD f/u. Saw cardiologist 04/29/11 and was put on statin and ARB.  Nothing else new. Checking gluc qod now (twice on that day), fasting avg 130, 2 H PP 170-180.  He didn't increase mealtime insulin to 14 U b/c he felt like 170-180 postprandial was ok according to our past conversations. He also has only been taking 18 U of Lantus hs instead of the 20U I recommended last time.  He has cut his smoking in half! Has appt with nutritionist next week.  Still with SS chest discomfort at beginning of exercise that abates after he continues exercising.  No cough, no dizziness, no palpitations. I started him on prilosec 40mg  qd last visit and he took 1 mo and has not continued it.  Past Medical History  Diagnosis Date  . Diabetes mellitus 2012    "sugar a little bit up but not diabetes" is how the patient stated it (no records available).  HbA1c here in fall 2012 was UP.  Marland Kitchen Hemorrhagic, fever 1993    No residual effects  . Coronary artery disease 2011    Stents x3, put on plavix and ASA--with further questioning he apparently had no symptoms but he told an MD in [place] that he had some SOB at times and the MD put the stents in as a PROPHYLAXIS against CAD (pt says actual ischemia or CAD was NOT confirmed--no records available.  I encouraged patient to go ahead and d/c plavix after he had been on it for one year but he insists on continuing it.  . Tobacco dependence     1/2 ppd x 40 yrs    Past Surgical History  Procedure Date  . Appendectomy age 62  . Esophagogastroduodenoscopy     Done for upper abd pains: normal per pt report.  . Nm myoview ltd 04/2011     (Dr. Eden Emms) Negative except hypertensive response to exercise.    Outpatient Prescriptions Prior to Visit    Medication Sig Dispense Refill  . aspirin 81 MG tablet Take 81 mg by mouth daily.        Marland Kitchen atorvastatin (LIPITOR) 20 MG tablet Take 1 tablet (20 mg total) by mouth daily.  30 tablet  11  . clopidogrel (PLAVIX) 75 MG tablet Take 75 mg by mouth daily.        Marland Kitchen glucose blood (FREESTYLE LITE) test strip Check glucose twice per day  100 each  5  . Insulin Pen Needle (B-D UF III MINI PEN NEEDLES) 31G X 5 MM MISC Use as directed for insulin injections  30 each  2  . Lancets (FREESTYLE) lancets Check glucose twice daily  100 each  5  . losartan (COZAAR) 50 MG tablet Take 1 tablet (50 mg total) by mouth daily.  30 tablet  11  . nitroGLYCERIN (NITROSTAT) 0.4 MG SL tablet Place 1 tablet (0.4 mg total) under the tongue every 5 (five) minutes as needed for chest pain.  30 tablet  0  . omeprazole (PRILOSEC) 40 MG capsule Take 1 capsule (40 mg total) by mouth daily.  30 capsule  3  . insulin aspart (NOVOLOG) 100 UNIT/ML injection Inject 12 Units into the skin 3 (three) times daily  before meals.  1 pen  5  . insulin glargine (LANTUS) 100 UNIT/ML injection Inject 18 Units into the skin at bedtime.  5 pen  5    Allergies  Allergen Reactions  . Amoxil Itching and Rash    swollen hands    ROS As per HPI  PE: Blood pressure 106/74, pulse 66, temperature 97.4 F (36.3 C), temperature source Temporal, height 5' 8.5" (1.74 m), weight 191 lb (86.637 kg). Gen: Alert, well appearing.  Patient is oriented to person, place, time, and situation. CV: RRR, no m/r/g.   LUNGS: CTA bilat, nonlabored resps, good aeration in all lung fields.   LABS:  None today  IMPRESSION AND PLAN:  Type II or unspecified type diabetes mellitus without mention of complication, uncontrolled Control fair/improving, but he is hesitant to self manage/increase insulins as I recommend. Will increase to 20 U lantus and 14 U mealtime insulin as we had tried to do LAST f/u visit. Monitoring parameters reviewed, discussed possible need  to down-titrate insulin if he starts following diabetic diet after seeing nutritionist. Eye exam UTD, will need to track this down. Insulins RF'd today.   GERD (gastroesophageal reflux disease) Mild improvement.  Needs to continue omeprazole 40mg  at least 2 more months.  Tobacco dependence Improved/cutting back! Encouraged pt to continue to do so.   He'll consider my recommendation of Tdap and zostavax ---educ handouts given today to review.  FOLLOW UP: Return in about 1 month (around 06/12/2011) for f/u DM 2 and GERD.

## 2011-05-12 NOTE — Assessment & Plan Note (Signed)
Control fair/improving, but he is hesitant to self manage/increase insulins as I recommend. Will increase to 20 U lantus and 14 U mealtime insulin as we had tried to do LAST f/u visit. Monitoring parameters reviewed, discussed possible need to down-titrate insulin if he starts following diabetic diet after seeing nutritionist. Eye exam UTD, will need to track this down. Insulins RF'd today.

## 2011-05-12 NOTE — Assessment & Plan Note (Signed)
Mild improvement.  Needs to continue omeprazole 40mg  at least 2 more months.

## 2011-05-12 NOTE — Patient Instructions (Signed)
Tdap vaccine and Zostavax vaccine (shingles)---consider these for future visits and read educational matirials given today.

## 2011-05-16 ENCOUNTER — Encounter: Payer: PRIVATE HEALTH INSURANCE | Attending: Family Medicine | Admitting: *Deleted

## 2011-05-16 ENCOUNTER — Encounter: Payer: Self-pay | Admitting: *Deleted

## 2011-05-16 VITALS — Ht 69.0 in | Wt 192.7 lb

## 2011-05-16 DIAGNOSIS — E119 Type 2 diabetes mellitus without complications: Secondary | ICD-10-CM | POA: Insufficient documentation

## 2011-05-16 DIAGNOSIS — Z713 Dietary counseling and surveillance: Secondary | ICD-10-CM | POA: Insufficient documentation

## 2011-05-16 NOTE — Progress Notes (Signed)
  Medical Nutrition Therapy:  Appt start time: 0900 end time:  1000.   Assessment:  Primary concerns today: patient here with interpretor and his wife. He states he was diagnosed with diabetes less than a year ago and started on insulin about 3 months ago. He states that [place] typically eat 50 grams of carbohydrate per meal, and he has been doing that for many years. Although he then admitted that he eats more carbohydrate with evening meal and has been trying to cut back on that lately. He exercises daily by slow running or walking. He states he is now checking his BG before Breakfast and after his supper meal.  MEDICATIONS: see list, Diabetes medications include Lantus @ 20 units at bedtime and Novolog @ 12-14 units before each meal   DIETARY INTAKE:  Usual eating pattern includes 3 meals and 2-3 snacks per day.  Everyday foods include good variety of all food groups.  Avoided foods include red meats.    24-hr recall:  B ( AM): 2 bread, with jam and egg, coffee with milk Snk ( AM): none or maybe fruit  L ( PM): rice with pork, large portion of vegetables, water occasionally Snk ( PM): none D ( PM): bread or noodle or dumpling, small amount of pork, vegetables, soup to drink Snk ( PM): fruit and maybe nuts Beverages: water, coffee, soup,   Usual physical activity: slow running every AM and/or walks in evening  Estimated energy needs: 2000 calories 225 g carbohydrates 150 g protein 56 g fat  Progress Towards Goal(s):  In progress.   Nutritional Diagnosis:  NB-1.1 Food and nutrition-related knowledge deficit As related to diabetes.  As evidenced by no previous diabetes education.    Intervention:  Nutrition counseling and diabetes education provided through interpretor. Discussed basic physiology of diabetes, action of both types of insulin, rationale of testing BG as directed by MD, factors affecting BG including food, insulin and activity level.  Handouts given during visit  include:  Food Label handout  Medication handout  Monitoring/Evaluation:  Dietary intake, exercise, insulin administration, and body weight prn.

## 2011-06-06 ENCOUNTER — Ambulatory Visit (INDEPENDENT_AMBULATORY_CARE_PROVIDER_SITE_OTHER): Payer: PRIVATE HEALTH INSURANCE | Admitting: Family Medicine

## 2011-06-06 ENCOUNTER — Encounter: Payer: Self-pay | Admitting: Family Medicine

## 2011-06-06 VITALS — BP 113/75 | HR 67 | Temp 97.4°F | Ht 68.5 in | Wt 192.0 lb

## 2011-06-06 DIAGNOSIS — F17200 Nicotine dependence, unspecified, uncomplicated: Secondary | ICD-10-CM

## 2011-06-06 DIAGNOSIS — J302 Other seasonal allergic rhinitis: Secondary | ICD-10-CM | POA: Insufficient documentation

## 2011-06-06 DIAGNOSIS — J309 Allergic rhinitis, unspecified: Secondary | ICD-10-CM

## 2011-06-06 MED ORDER — FLUTICASONE PROPIONATE 50 MCG/ACT NA SUSP: 2.0000 | Freq: Every day | NASAL | Status: DC

## 2011-06-06 MED ORDER — FEXOFENADINE HCL 180 MG PO TABS: 180.0000 mg | ORAL_TABLET | Freq: Every day | ORAL | Status: DC

## 2011-06-06 NOTE — Assessment & Plan Note (Signed)
Control improving. Continue current monitoring and up titrate lantus to get to fasting CBG goal range 100-110 consistently.

## 2011-06-06 NOTE — Assessment & Plan Note (Signed)
Start flonase daily and allegra 180mg  daily.

## 2011-06-06 NOTE — Progress Notes (Signed)
OFFICE NOTE  06/06/2011  CC:  Chief Complaint  Patient presents with  . Follow-up    DM     HPI: Patient is a 61 y.o. Asian male who is here for DM 2 and tobacco dependence f/u. Reports that glucoses "higher" in the last 1 wk.  Has been increasing insulins. Postprandial range 150-180.  Fasting consistently >150.   Lantus 20 qhs and novolog 16 at mealtime.   Saw nutritionist and says he was told his diet is pretty good, not many changes necessary. He is still cutting back on smoking, slowly but surely.  Offered med assistance today for quitting but he declined at this time.  C/o seasonal allergies: takes OTC allergy spray currently and it doesn't help much.    Pertinent PMH:  Past Medical History  Diagnosis Date  . Diabetes mellitus 2012    "sugar a little bit up but not diabetes" is how the patient stated it (no records available).  HbA1c here in fall 2012 was UP.  Marland Kitchen Hemorrhagic, fever 1993    No residual effects  . Coronary artery disease 2011    Stents x3, put on plavix and ASA--with further questioning he apparently had no symptoms but he told an MD in [place] that he had some SOB at times and the MD put the stents in as a PROPHYLAXIS against CAD (pt says actual ischemia or CAD was NOT confirmed--no records available.  I encouraged patient to go ahead and d/c plavix after he had been on it for one year but he insists on continuing it.  . Tobacco dependence     1/2 ppd x 40 yrs   Past surgical, social, and family history reviewed and no changes noted since last office visit.  MEDS:  Outpatient Prescriptions Prior to Visit  Medication Sig Dispense Refill  . aspirin 81 MG tablet Take 81 mg by mouth daily.        Marland Kitchen atorvastatin (LIPITOR) 20 MG tablet Take 1 tablet (20 mg total) by mouth daily.  30 tablet  11  . glucose blood (FREESTYLE LITE) test strip Check glucose twice per day  100 each  5  . insulin glargine (LANTUS) 100 UNIT/ML injection Inject 20 Units into the skin at  bedtime.  5 pen  5  . Insulin Pen Needle (B-D UF III MINI PEN NEEDLES) 31G X 5 MM MISC Use as directed for insulin injections  30 each  2  . Lancets (FREESTYLE) lancets Check glucose twice daily  100 each  5  . losartan (COZAAR) 50 MG tablet Take 1 tablet (50 mg total) by mouth daily.  30 tablet  11  . nitroGLYCERIN (NITROSTAT) 0.4 MG SL tablet Place 1 tablet (0.4 mg total) under the tongue every 5 (five) minutes as needed for chest pain.  30 tablet  0  . insulin aspart (NOVOLOG) 100 UNIT/ML injection Inject 14 Units into the skin 3 (three) times daily before meals.  1 pen  5  . omeprazole (PRILOSEC) 40 MG capsule Take 1 capsule (40 mg total) by mouth daily.  30 capsule  3  . clopidogrel (PLAVIX) 75 MG tablet Take 75 mg by mouth daily.          PE: Blood pressure 113/75, pulse 67, temperature 97.4 F (36.3 C), temperature source Temporal, height 5' 8.5" (1.74 m), weight 192 lb (87.091 kg). VS: noted--normal. Gen: alert, NAD, NONTOXIC APPEARING. HEENT: eyes without injection, drainage, or swelling.  Ears: EACs clear, TMs with normal light reflex and  landmarks.  Nose: Clear rhinorrhea, with some dried, crusty exudate adherent to mildly injected mucosa.  No purulent d/c.  No paranasal sinus TTP.  No facial swelling.  Throat and mouth without focal lesion.  No pharyngial swelling, erythema, or exudate.   Neck: supple, no LAD.   LUNGS: CTA bilat, nonlabored resps.   CV: RRR, no m/r/g. EXT: no c/c/e SKIN: no rash    IMPRESSION AND PLAN:  Seasonal allergic rhinitis Start flonase daily and allegra 180mg  daily.   Type II or unspecified type diabetes mellitus without mention of complication, uncontrolled Control improving. Continue current monitoring and up titrate lantus to get to fasting CBG goal range 100-110 consistently.  Tobacco dependence Continue to cut back. Offered med assistance today but he declined.      FOLLOW UP: 1 mo

## 2011-06-06 NOTE — Assessment & Plan Note (Signed)
Continue to cut back. Offered med assistance today but he declined.

## 2011-07-04 ENCOUNTER — Ambulatory Visit (INDEPENDENT_AMBULATORY_CARE_PROVIDER_SITE_OTHER): Payer: PRIVATE HEALTH INSURANCE | Admitting: Family Medicine

## 2011-07-04 ENCOUNTER — Encounter: Payer: Self-pay | Admitting: Family Medicine

## 2011-07-04 VITALS — BP 133/77 | HR 66 | Temp 98.5°F | Ht 68.5 in | Wt 190.8 lb

## 2011-07-04 DIAGNOSIS — F17200 Nicotine dependence, unspecified, uncomplicated: Secondary | ICD-10-CM

## 2011-07-04 DIAGNOSIS — IMO0001 Reserved for inherently not codable concepts without codable children: Secondary | ICD-10-CM

## 2011-07-04 NOTE — Assessment & Plan Note (Signed)
Control improving.  Continue current insulin regimen (20 U lantus qhs, mealtime insulin at 02-10-15 units. Discussed ways to avoid hypoglycemia. If HbA1c at next f/u in 3 wks is <7.5% then I'll try to simplify his insulin regimen +/- retry an oral med.

## 2011-07-04 NOTE — Assessment & Plan Note (Signed)
Continues to cut back. Congratulated him on this today and encouraged goal of total cessation.

## 2011-07-04 NOTE — Progress Notes (Signed)
OFFICE VISIT  07/04/2011   CC:  Chief Complaint  Patient presents with  . Follow-up    diabetes     HPI:    Patient is a 61 y.o. Asian male who presents for 3 wk DM 2 f/u. Fasting glucs 130s, 2H PP same.  Has had 3 low sugar readings with hypoglycemic sx's since last visit (in the 50s) --these occurred after his exercise in the evening around 7pm or after exercise in the AM.  He ate some chocolate and it resolved. Lantus 20 U qhs, mealtime insulin 12 U BF 10 U lunch 16 U supper. He has continued to cut down on cigarettes, now smoking only about 3 cigs per day. He continues to express desire for simpler diabetic regimen.  He went to nutritionist and says this was helpful and he's been trying to follow their rec's.  ROS: no change in his recurrent chest pain that has been deemed noncardiac (gets some substernal pain as he starts to exercise and as he continues to exercise for 10 min or so the pain goes away and he finishes his exercise pain-free.  He has been taking some sublingual nitroglycerin for this at times but he admits he really doesn't know if he's supposed to be taking it for this.    Past Medical History  Diagnosis Date  . Diabetes mellitus 2012    "sugar a little bit up but not diabetes" is how the patient stated it (no records available).  HbA1c here in fall 2012 was UP.  Marland Kitchen Hemorrhagic, fever 1993    No residual effects  . Coronary artery disease 2011    Stents x3, put on plavix and ASA--with further questioning he apparently had no symptoms but he told an MD in [place] that he had some SOB at times and the MD put the stents in as a PROPHYLAXIS against CAD (pt says actual ischemia or CAD was NOT confirmed--no records available.  I encouraged patient to go ahead and d/c plavix after he had been on it for one year but he insists on continuing it.  . Tobacco dependence     1/2 ppd x 40 yrs    Past Surgical History  Procedure Date  . Appendectomy age 56  .  Esophagogastroduodenoscopy     Done for upper abd pains: normal per pt report.  . Nm myoview ltd 04/2011     (Dr. Eden Emms) Negative except hypertensive response to exercise.    Outpatient Prescriptions Prior to Visit  Medication Sig Dispense Refill  . aspirin 81 MG tablet Take 81 mg by mouth daily.        Marland Kitchen atorvastatin (LIPITOR) 20 MG tablet Take 1 tablet (20 mg total) by mouth daily.  30 tablet  11  . glucose blood (FREESTYLE LITE) test strip Check glucose twice per day  100 each  5  . insulin aspart (NOVOLOG) 100 UNIT/ML injection Inject 16 Units into the skin 3 (three) times daily before meals.      . insulin glargine (LANTUS) 100 UNIT/ML injection Inject 20 Units into the skin at bedtime.  5 pen  5  . Insulin Pen Needle (B-D UF III MINI PEN NEEDLES) 31G X 5 MM MISC Use as directed for insulin injections  30 each  2  . Lancets (FREESTYLE) lancets Check glucose twice daily  100 each  5  . losartan (COZAAR) 50 MG tablet Take 1 tablet (50 mg total) by mouth daily.  30 tablet  11  . nitroGLYCERIN (  NITROSTAT) 0.4 MG SL tablet Place 1 tablet (0.4 mg total) under the tongue every 5 (five) minutes as needed for chest pain.  30 tablet  0  . fexofenadine (ALLEGRA) 180 MG tablet Take 1 tablet (180 mg total) by mouth daily.  30 tablet  11  . fluticasone (FLONASE) 50 MCG/ACT nasal spray Place 2 sprays into the nose daily.  16 g  6  . omeprazole (PRILOSEC) 40 MG capsule Take 1 capsule (40 mg total) by mouth daily.  30 capsule  3    Allergies  Allergen Reactions  . Amoxicillin Itching and Rash    swollen hands    ROS As per HPI  PE: Blood pressure 133/77, pulse 66, temperature 98.5 F (36.9 C), temperature source Temporal, height 5' 8.5" (1.74 m), weight 190 lb 12.8 oz (86.546 kg), SpO2 98.00%. Gen: Alert, well appearing.  Patient is oriented to person, place, time, and situation. CV: RRR, no m/r/g.   LUNGS: CTA bilat, nonlabored resps, good aeration in all lung fields.  LABS:  None  today.  Lab Results  Component Value Date   HGBA1C 8.3* 04/16/2011     IMPRESSION AND PLAN:  Type II or unspecified type diabetes mellitus without mention of complication, uncontrolled Control improving.  Continue current insulin regimen (20 U lantus qhs, mealtime insulin at 02-10-15 units. Discussed ways to avoid hypoglycemia. If HbA1c at next f/u in 3 wks is <7.5% then I'll try to simplify his insulin regimen +/- retry an oral med.   Tobacco dependence Continues to cut back. Congratulated him on this today and encouraged goal of total cessation.   Spent 45 min with pt today, with >50% of that time spent counseling and educating regarding his DM 2, specifically the effects of diet and exercise on glucose and how to avoid and treat hypoglycemia.  Discussed importance of keeping home log of glucoses so he can use this info to try to learn how his body reacts to certain foods, certain activity levels, doses of insulin, etc.  We continue to have ongoing discussions about appropriate meds for him to treat his DM, his past hx of probs with oral meds and victoza, his desire to have fewer injections.  FOLLOW UP: Return in about 3 weeks (around 07/25/2011) for f/u DM 2.

## 2011-07-25 ENCOUNTER — Encounter: Payer: Self-pay | Admitting: Family Medicine

## 2011-07-25 ENCOUNTER — Ambulatory Visit (INDEPENDENT_AMBULATORY_CARE_PROVIDER_SITE_OTHER): Payer: PRIVATE HEALTH INSURANCE | Admitting: Family Medicine

## 2011-07-25 VITALS — BP 148/81 | HR 74 | Ht 68.5 in | Wt 191.0 lb

## 2011-07-25 DIAGNOSIS — K219 Gastro-esophageal reflux disease without esophagitis: Secondary | ICD-10-CM

## 2011-07-25 DIAGNOSIS — Z23 Encounter for immunization: Secondary | ICD-10-CM

## 2011-07-25 MED ORDER — OMEPRAZOLE 20 MG PO CPDR: 20.0000 mg | DELAYED_RELEASE_CAPSULE | Freq: Every day | ORAL | Status: AC

## 2011-07-25 NOTE — Progress Notes (Signed)
OFFICE NOTE  07/25/2011  CC:  Chief Complaint  Patient presents with  . Diabetes    3 week follow up     HPI: Patient is a 61 y.o. Asian male who is here with an interpreter for 3 wk f/u DM 2 and tobacco dependence. He is cutting back on cigarettes--now down to 3 cigarettes per day. Currently taking 18 U Lantus qhs and 02-09-13 U novolog at mealtimes. Review of glucose log today shows: fastings 100-140, 2H PP still 160s + most of the time. We spent a considerable amount of time today explaining the need for and the risks/benefits of getting pneumovax and Tdap.  C/o burning in substernal region in mornings esp, midepigastric burning at times. He does drink a beer or glass of white wine daily.    Pertinent PMH:  Past Medical History  Diagnosis Date  . Diabetes mellitus 2012    "sugar a little bit up but not diabetes" is how the patient stated it (no records available).  HbA1c here in fall 2012 was UP.  Marland Kitchen Hemorrhagic, fever 1993    No residual effects  . Coronary artery disease 2011    Stents x3, put on plavix and ASA--with further questioning he apparently had no symptoms but he told an MD in [place] that he had some SOB at times and the MD put the stents in as a PROPHYLAXIS against CAD (pt says actual ischemia or CAD was NOT confirmed--no records available.  I encouraged patient to go ahead and d/c plavix after he had been on it for one year but he insists on continuing it.  . Tobacco dependence     1/2 ppd x 40 yrs    MEDS:  Outpatient Prescriptions Prior to Visit  Medication Sig Dispense Refill  . aspirin 81 MG tablet Take 81 mg by mouth daily.        Marland Kitchen atorvastatin (LIPITOR) 20 MG tablet Take 1 tablet (20 mg total) by mouth daily.  30 tablet  11  . glucose blood (FREESTYLE LITE) test strip Check glucose twice per day  100 each  5  . insulin aspart (NOVOLOG) 100 UNIT/ML injection Inject 16 Units into the skin 3 (three) times daily before meals.      . insulin glargine (LANTUS)  100 UNIT/ML injection Inject 20 Units into the skin at bedtime.  5 pen  5  . Insulin Pen Needle (B-D UF III MINI PEN NEEDLES) 31G X 5 MM MISC Use as directed for insulin injections  30 each  2  . Lancets (FREESTYLE) lancets Check glucose twice daily  100 each  5  . losartan (COZAAR) 50 MG tablet Take 1 tablet (50 mg total) by mouth daily.  30 tablet  11  . nitroGLYCERIN (NITROSTAT) 0.4 MG SL tablet Place 1 tablet (0.4 mg total) under the tongue every 5 (five) minutes as needed for chest pain.  30 tablet  0    PE: Blood pressure 148/81, pulse 74, height 5' 8.5" (1.74 m), weight 191 lb (86.637 kg). Gen: Alert, well appearing.  Patient is oriented to person, place, time, and situation. CV: RRR, no m/r/g.   LUNGS: CTA bilat, nonlabored resps, good aeration in all lung fields. ABD: soft, NT, ND, BS normal.  No hepatospenomegaly or mass.  No bruits. EXT: no clubbing, cyanosis, or edema.   IMPRESSION AND PLAN:  Type II or unspecified type diabetes mellitus without mention of complication, uncontrolled Control improving but still need to help him titrate insulins up. Even  with interpreter, his understanding of his disease state and how to manage it is minimally adequate. Will increase lantus to 20 U (as we have tried to get him to do before), and increase mealtime regimen of novolog to 14 at BF, 12 lunch, and 16 supper.  Continue tid-qid glucose monitoring and bring to visits. He is UTD on all DM monitoring parameters except needs HbA1c today. Also, gave pneumovax and Tdap today.  GERD (gastroesophageal reflux disease) Start prilosec 20mg  once daily. Discussed GERD dietary recommendations and gave handout on this.      FOLLOW UP: 2mo

## 2011-07-25 NOTE — Assessment & Plan Note (Signed)
Start prilosec 20mg  once daily. Discussed GERD dietary recommendations and gave handout on this.

## 2011-07-25 NOTE — Patient Instructions (Addendum)

## 2011-07-25 NOTE — Assessment & Plan Note (Signed)
Control improving but still need to help him titrate insulins up. Even with interpreter, his understanding of his disease state and how to manage it is minimally adequate. Will increase lantus to 20 U (as we have tried to get him to do before), and increase mealtime regimen of novolog to 14 at BF, 12 lunch, and 16 supper.  Continue tid-qid glucose monitoring and bring to visits. He is UTD on all DM monitoring parameters except needs HbA1c today. Also, gave pneumovax and Tdap today.

## 2011-08-21 ENCOUNTER — Ambulatory Visit (INDEPENDENT_AMBULATORY_CARE_PROVIDER_SITE_OTHER): Payer: PRIVATE HEALTH INSURANCE | Admitting: Family Medicine

## 2011-08-21 ENCOUNTER — Encounter: Payer: Self-pay | Admitting: Family Medicine

## 2011-08-21 VITALS — BP 115/75 | HR 71 | Ht 68.5 in | Wt 191.0 lb

## 2011-08-21 DIAGNOSIS — IMO0001 Reserved for inherently not codable concepts without codable children: Secondary | ICD-10-CM

## 2011-08-21 DIAGNOSIS — K219 Gastro-esophageal reflux disease without esophagitis: Secondary | ICD-10-CM

## 2011-08-21 DIAGNOSIS — I10 Essential (primary) hypertension: Secondary | ICD-10-CM | POA: Insufficient documentation

## 2011-08-21 MED ORDER — LOSARTAN POTASSIUM 100 MG PO TABS: 100.0000 mg | ORAL_TABLET | Freq: Every day | ORAL | Status: DC

## 2011-08-21 NOTE — Assessment & Plan Note (Signed)
Much improved, currently quiescent.  May take PPI prn.

## 2011-08-21 NOTE — Assessment & Plan Note (Addendum)
Control gradually improving on lantus and novolog--will titrate lantus up to 24 U qhs and novolog up to 20 U qAC. Recheck 1 mo and bring in log of your glucose readings (alternate your after-meal glucose checks between breakfast, lunch, and supper.

## 2011-08-21 NOTE — Assessment & Plan Note (Signed)
Not ideal control as per his report of home bp's lately. Will increase cozaar to 100mg  qd. Continue close bp monitoring at home: his goal is <130/80.

## 2011-08-21 NOTE — Progress Notes (Signed)
OFFICE NOTE  08/21/2011  CC:  Chief Complaint  Patient presents with  . Diabetes    follow up, did not receive lab results after last visit  . Hypertension    feels good, elevated in afternoon per home cuff     HPI: Patient is a 61 y.o. Asian male who is here for 1 mo f/u GERD, DM 2, and HTN.  Here with interpreter Connye Burkitt today. He no longer is having any reflux symptoms, even while off his PPI lately. I gave him the news that his HbA1c last month was down only a tiny bit from 3 mo prior (8.3% down to 8.1%). Over the last 61mo he reports fasting gluc 110-120.  Two hour PP 160-180. Has been taking 20 U lantus qhs and 16 U mealtime insulin each meal. No hypoglycemia.  BP 150s systolic some recently, mostly 140s (feels occipital HA with syst in 150s).  Diastolics <90.  Still smoking, but now is down to 2 cigarettes per day.    Has a small area on left upper arm with itchy rash, thinks it was an insect bite that he scratched at too much.  ROS: no chest pain, no SOB, no fevers, no dizziness, no n/v/d  Pertinent PMH:  Past Medical History  Diagnosis Date  . Diabetes mellitus 2012    "sugar a little bit up but not diabetes" is how the patient stated it (no records available).  HbA1c here in fall 2012 was UP.  Marland Kitchen Hemorrhagic, fever 1993    No residual effects  . Coronary artery disease 2011    Stents x3, put on plavix and ASA--with further questioning he apparently had no symptoms but he told an MD in [place] that he had some SOB at times and the MD put the stents in as a PROPHYLAXIS against CAD (pt says actual ischemia or CAD was NOT confirmed--no records available.  I encouraged patient to go ahead and d/c plavix after he had been on it for one year but he insists on continuing it.  . Tobacco dependence     1/2 ppd x 40 yrs   Past surgical, social, and family history reviewed and no changes noted since last office visit.  MEDS:  Outpatient Prescriptions Prior to Visit  Medication Sig  Dispense Refill  . aspirin 81 MG tablet Take 81 mg by mouth daily.        Marland Kitchen atorvastatin (LIPITOR) 20 MG tablet Take 1 tablet (20 mg total) by mouth daily.  30 tablet  11  . glucose blood (FREESTYLE LITE) test strip Check glucose twice per day  100 each  5  . insulin aspart (NOVOLOG) 100 UNIT/ML injection Inject 16 Units into the skin 3 (three) times daily before meals.      . insulin glargine (LANTUS) 100 UNIT/ML injection Inject 20 Units into the skin at bedtime.  5 pen  5  . Insulin Pen Needle (B-D UF III MINI PEN NEEDLES) 31G X 5 MM MISC Use as directed for insulin injections  30 each  2  . Lancets (FREESTYLE) lancets Check glucose twice daily  100 each  5  . losartan (COZAAR) 50 MG tablet Take 1 tablet (50 mg total) by mouth daily.  30 tablet  11  . nitroGLYCERIN (NITROSTAT) 0.4 MG SL tablet Place 1 tablet (0.4 mg total) under the tongue every 5 (five) minutes as needed for chest pain.  30 tablet  0  . omeprazole (PRILOSEC) 20 MG capsule Take 1 capsule (20 mg  total) by mouth daily.  30 capsule  11    PE: Blood pressure 115/75, pulse 71, height 5' 8.5" (1.74 m), weight 191 lb (86.637 kg). Gen: Alert, well appearing.  Patient is oriented to person, place, time, and situation. ENT:  Eyes: no injection, icteris, swelling, or exudate.  EOMI, PERRLA. Nose: no drainage or turbinate edema/swelling.  No injection or focal lesion.  Mouth: lips without lesion/swelling.  Oral mucosa pink and moist.  Oropharynx without erythema, exudate, or swelling.  Neck: supple, no LAD or TM CV: RRR, no m/r/g.   LUNGS: CTA bilat, nonlabored resps, good aeration in all lung fields. SKIN: underside of left upper arm near armpit has a 1-2 cm irregular shaped area of coalesced papules with superficial flaking.  No erythema or tenderness.  No vesicles, pustules, or petechiae.  IMPRESSION AND PLAN:  Type II or unspecified type diabetes mellitus without mention of complication, uncontrolled Control gradually improving  on lantus and novolog--will titrate lantus up to 24 U qhs and novolog up to 20 U qAC. Recheck 1 mo and bring in log of your glucose readings (alternate your after-meal glucose checks between breakfast, lunch, and supper.    HTN (hypertension), benign Not ideal control as per his report of home bp's lately. Will increase cozaar to 100mg  qd. Continue close bp monitoring at home: his goal is <130/80.  GERD (gastroesophageal reflux disease) Much improved, currently quiescent.  May take PPI prn.   Dermatitis: localized, likely exaggerated response to insect bite+ pt scratching. I recommended he first try OTC hydrocortisone ointment bid to the area.  FOLLOW UP: 1 mo

## 2011-08-21 NOTE — Patient Instructions (Addendum)
Increase your Lantus to 24 Units at bedtime every night. Increase your mealtime insulin to 20 units at each meal. Recheck 1 mo and bring in log of your glucose readings (alternate your after-meal glucose checks between breakfast, lunch, and supper.  Start taking 100mg  of your losartan (cozaar)--finish any 50mg  tabs by taking 2 at a time each day, then pick up rx for the 100mg  pill and take one daily.  Buy over the counter (generic) hydrocortisone ointment and apply to the affected area twice per day.

## 2011-09-01 DIAGNOSIS — D352 Benign neoplasm of pituitary gland: Secondary | ICD-10-CM

## 2011-09-01 HISTORY — DX: Benign neoplasm of pituitary gland: D35.2

## 2011-09-07 ENCOUNTER — Encounter (HOSPITAL_BASED_OUTPATIENT_CLINIC_OR_DEPARTMENT_OTHER): Payer: Self-pay | Admitting: *Deleted

## 2011-09-07 ENCOUNTER — Emergency Department (HOSPITAL_BASED_OUTPATIENT_CLINIC_OR_DEPARTMENT_OTHER): Payer: PRIVATE HEALTH INSURANCE

## 2011-09-07 ENCOUNTER — Emergency Department (HOSPITAL_BASED_OUTPATIENT_CLINIC_OR_DEPARTMENT_OTHER)
Admission: EM | Admit: 2011-09-07 | Discharge: 2011-09-07 | Disposition: A | Discharge status: Home / Self Care | Payer: PRIVATE HEALTH INSURANCE | Attending: Emergency Medicine | Admitting: Emergency Medicine

## 2011-09-07 DIAGNOSIS — R112 Nausea with vomiting, unspecified: Secondary | ICD-10-CM | POA: Insufficient documentation

## 2011-09-07 DIAGNOSIS — E119 Type 2 diabetes mellitus without complications: Secondary | ICD-10-CM | POA: Insufficient documentation

## 2011-09-07 DIAGNOSIS — H5710 Ocular pain, unspecified eye: Secondary | ICD-10-CM | POA: Insufficient documentation

## 2011-09-07 DIAGNOSIS — I2510 Atherosclerotic heart disease of native coronary artery without angina pectoris: Secondary | ICD-10-CM | POA: Insufficient documentation

## 2011-09-07 DIAGNOSIS — Z794 Long term (current) use of insulin: Secondary | ICD-10-CM | POA: Insufficient documentation

## 2011-09-07 DIAGNOSIS — R42 Dizziness and giddiness: Secondary | ICD-10-CM | POA: Insufficient documentation

## 2011-09-07 LAB — CBC WITH DIFFERENTIAL/PLATELET
Eosinophils Absolute: 0.1 10*3/uL (ref 0.0–0.7)
Eosinophils Relative: 2 % (ref 0–5)
HCT: 39.3 % (ref 39.0–52.0)
Hemoglobin: 13.5 g/dL (ref 13.0–17.0)
Lymphs Abs: 1.4 10*3/uL (ref 0.7–4.0)
MCH: 31.2 pg (ref 26.0–34.0)
MCV: 90.8 fL (ref 78.0–100.0)
Monocytes Absolute: 0.6 10*3/uL (ref 0.1–1.0)
Monocytes Relative: 9 % (ref 3–12)
RBC: 4.33 MIL/uL (ref 4.22–5.81)

## 2011-09-07 LAB — BASIC METABOLIC PANEL
BUN: 11 mg/dL (ref 6–23)
Calcium: 9.3 mg/dL (ref 8.4–10.5)
GFR calc non Af Amer: >90 mL/min (ref >90)
Glucose, Bld: 138 mg/dL — ABNORMAL HIGH (ref 70–99)

## 2011-09-07 MED ORDER — FLUORESCEIN SODIUM 1 MG OP STRP
1.0000 | ORAL_STRIP | Freq: Once | OPHTHALMIC | Status: DC
  Filled 2011-09-07: qty 1

## 2011-09-07 MED ORDER — MORPHINE SULFATE 4 MG/ML IJ SOLN
4.0000 mg | Freq: Once | INTRAMUSCULAR | Status: AC
  Administered 2011-09-07: 4 mg via INTRAVENOUS
  Filled 2011-09-07: qty 1

## 2011-09-07 MED ORDER — HYDROCODONE-ACETAMINOPHEN 5-500 MG PO TABS: 1.0000 | ORAL_TABLET | ORAL | Status: AC | PRN

## 2011-09-07 MED ORDER — ONDANSETRON HCL 4 MG/2ML IJ SOLN
4.0000 mg | Freq: Once | INTRAMUSCULAR | Status: AC
  Administered 2011-09-07: 4 mg via INTRAVENOUS
  Filled 2011-09-07: qty 2

## 2011-09-07 MED ORDER — TETRACAINE HCL 0.5 % OP SOLN: 2.0000 [drp] | Freq: Once | OPHTHALMIC | Status: DC

## 2011-09-07 MED ORDER — SODIUM CHLORIDE 0.9 % IV BOLUS (SEPSIS)
1000.0000 mL | Freq: Once | INTRAVENOUS | Status: AC
  Administered 2011-09-07: 1000 mL via INTRAVENOUS

## 2011-09-07 MED ORDER — TETRACAINE HCL 0.5 % OP SOLN
OPHTHALMIC | Status: AC
  Filled 2011-09-07: qty 2

## 2011-09-07 MED ORDER — ONDANSETRON HCL 4 MG PO TABS: 4.0000 mg | ORAL_TABLET | Freq: Four times a day (QID) | ORAL | Status: AC

## 2011-09-07 NOTE — ED Notes (Signed)
Patient's family states Lawrence Randall has been c/o nausea/dizziness & L eye pain for the past three days, able to drink fluids, but constantly feels like he is going to vomit, diabetic, but has not checked glucose in three days

## 2011-09-07 NOTE — ED Provider Notes (Signed)
History     CSN: [id number]  Arrival date & time 09/07/11  1021   First MD Initiated Contact with Patient 09/07/11 1043      Chief Complaint  Patient presents with  . Dizziness    (Consider location/radiation/quality/duration/timing/severity/associated sxs/prior treatment) HPI  Interpreter- pt's family- pt and family declined formal interpreter  61yoM h/o IDDM pw eye pain x 3 days. The pain as an 8/10 at this time. He describes it as sharp and change in vision associated. He denies blurry vision but states she's had intermittent double vision. None currently. He denies fevers, chills. He denies headache. No photophobia. No floaters. He does complain of pain to the left side of his mouth. He denies history of similar. He states that his glucose has been well-controlled. He/OP seen an eye doctor in the past although he thinks he might have and did not remember name or address of the position. He does complain of nausea without vomiting. He complains of lightheadedness and states that his lightheadedness is worse today, prompting ED visit. Denies blood thinners. Denies head trauma. Denies numbness, tingling, weakness of extremities  ED Notes, ED Provider Notes from 09/07/11 0000 to 09/07/11 10:54:48       Griffin Basil, RN 09/07/2011 10:49      Patient's family states Mr Fluckiger has been c/o nausea/dizziness & L eye pain for the past three days, able to drink fluids, but constantly feels like he is going to vomit, diabetic, but has not checked glucose in three days    Past Medical History  Diagnosis Date  . Diabetes mellitus 2012    "sugar a little bit up but not diabetes" is how the patient stated it (no records available).  HbA1c here in fall 2012 was UP.  Marland Kitchen Hemorrhagic, fever 1993    No residual effects  . Coronary artery disease 2011    Stents x3, put on plavix and ASA--with further questioning he apparently had no symptoms but he told an MD in [place] that he had some SOB at times and the  MD put the stents in as a PROPHYLAXIS against CAD (pt says actual ischemia or CAD was NOT confirmed--no records available.  I encouraged patient to go ahead and d/c plavix after he had been on it for one year but he insists on continuing it.  . Tobacco dependence     1/2 ppd x 40 yrs    Past Surgical History  Procedure Date  . Appendectomy age 45  . Esophagogastroduodenoscopy     Done for upper abd pains: normal per pt report.  . Nm myoview ltd 04/2011     (Dr. Eden Emms) Negative except hypertensive response to exercise.  . Cardiac catheterization     Family History  Problem Relation Age of Onset  . Heart disease Mother     chf  . Diabetes Mother     History  Substance Use Topics  . Smoking status: Current Everyday Smoker -- 0.5 packs/day for 40 years    Types: Cigarettes  . Smokeless tobacco: Never Used  . Alcohol Use: No    Review of Systems  All other systems reviewed and are negative.  except as noted HPI   Allergies  Amoxicillin  Home Medications   Current Outpatient Rx  Name Route Sig Dispense Refill  . ASPIRIN 81 MG PO TABS Oral Take 81 mg by mouth daily.      . ATORVASTATIN CALCIUM 20 MG PO TABS Oral Take 1 tablet (20 mg total)  by mouth daily. 30 tablet 11  . GLUCOSE BLOOD VI STRP  Check glucose twice per day 100 each 5  . HYDROCODONE-ACETAMINOPHEN 5-500 MG PO TABS Oral Take 1 tablet by mouth every 4 (four) hours as needed for pain. 15 tablet 0  . INSULIN ASPART 100 UNIT/ML [place] SOLN Subcutaneous Inject 16 Units into the skin 3 (three) times daily before meals.    . INSULIN GLARGINE 100 UNIT/ML [place] SOLN Subcutaneous Inject 20 Units into the skin at bedtime. 5 pen 5  . INSULIN PEN NEEDLE 31G X 5 MM MISC  Use as directed for insulin injections 30 each 2  . FREESTYLE LANCETS MISC  Check glucose twice daily 100 each 5  . LOSARTAN POTASSIUM 100 MG PO TABS Oral Take 1 tablet (100 mg total) by mouth daily. 30 tablet 3  . NITROGLYCERIN 0.4 MG SL SUBL Sublingual Place 1  tablet (0.4 mg total) under the tongue every 5 (five) minutes as needed for chest pain. 30 tablet 0  . OMEPRAZOLE 20 MG PO CPDR Oral Take 1 capsule (20 mg total) by mouth daily. 30 capsule 11  . ONDANSETRON HCL 4 MG PO TABS Oral Take 1 tablet (4 mg total) by mouth every 6 (six) hours. 12 tablet 0    BP 136/68  Pulse 62  Temp 97.5 F (36.4 C) (Oral)  Resp 18  Ht 5\' 9"  (1.753 m)  Wt 190 lb (86.183 kg)  BMI 28.06 kg/m2  SpO2 96%  Physical Exam  Nursing note and vitals reviewed. Constitutional: He is oriented to person, place, and time. He appears well-developed and well-nourished. No distress.  HENT:  Head: Atraumatic.  Mouth/Throat: Oropharynx is clear and moist.       Dentition intact No ttp No abscess  Eyes: EOM are normal. Pupils are equal, round, and reactive to light. Right eye exhibits no discharge. Left eye exhibits no discharge. No scleral icterus.       Lt eye min conjunctival redness No photophobia No FB under eyelid No abnl under UV light Pressure Left avg 22 Right avg 21 VA Lt 20/40 Rt 20/20  Neck: Neck supple.  Cardiovascular: Normal rate, regular rhythm, normal heart sounds and intact distal pulses.  Exam reveals no gallop and no friction rub.   No murmur heard. Pulmonary/Chest: Effort normal. No respiratory distress. He has no wheezes. He has no rales.  Abdominal: Soft. Bowel sounds are normal. There is no tenderness. There is no rebound and no guarding.  Musculoskeletal: Normal range of motion. He exhibits no edema and no tenderness.       No Temporal artery ttp  Neurological: He is alert and oriented to person, place, and time. No cranial nerve deficit. He exhibits normal muscle tone. Coordination normal.  Skin: Skin is warm and dry.  Psychiatric: He has a normal mood and affect.    ED Course  Procedures (including critical care time)  Labs Reviewed  GLUCOSE, CAPILLARY - Abnormal; Notable for the following:    Glucose-Capillary 118 (*)     All other  components within normal limits  CBC WITH DIFFERENTIAL - Abnormal; Notable for the following:    Platelets 131 (*)     All other components within normal limits  BASIC METABOLIC PANEL - Abnormal; Notable for the following:    Glucose, Bld 138 (*)     All other components within normal limits   Ct Head Wo Contrast  09/07/2011  *RADIOLOGY REPORT*  Clinical Data: Left-sided eye pain, dizziness, nausea and  vomiting  CT HEAD WITHOUT CONTRAST  Technique:  Contiguous axial images were obtained from the base of the skull through the vertex without contrast.  Comparison: None.  Findings: Wallace Cullens white differentiation is maintained.  No CT evidence of acute large territory infarct.  No intraparenchymal or extra- axial mass or hemorrhage.  Normal size and configuration of the ventricles and basilar cisterns.  No midline shift.  Likely postsurgical change of the medial wall of the right maxillary sinus.  Polypoid mucosal thickening is seen within the bilateral maxillary sinuses, left greater than right.  The remaining paranasal sinuses and mastoid air cells are normal. Regional soft tissues are normal.  No displaced calvarial fracture.  IMPRESSION: 1. Negative noncontrast head CT. 2.  Likely postsurgical change of the right maxillary sinus with bilateral polypoid maxillary sinus disease.  No air fluid levels.  Original Report Authenticated By: Waynard Reeds, M.D.    1. Eye pain   2. Dizziness     MDM  Eye pain, dizziness, nausea vomiting of unclear etiology x 3 days. The patient's glucose is controlled here in the emergency department. He CT head is unremarkable for intracranial lesion. His eye exam is normal except for his visual acuities. There is no increased pressure. He has no temporal artery tenderness to palpation. No photophobia suggestive of iritis. His dentition is intact. Feeling better in ED with morphine, zofran, IVF. I discussed this case with Dr. Charlotte Sanes ophthalmology who will see in office tomorrow  morning at 0815. Patient and family agreeable with plan. No EMC precluding discharge at this time. Given Precautions for return. PMD f/u.         Forbes Cellar, MD 09/07/11 [phone]

## 2011-09-07 NOTE — Discharge Instructions (Signed)
Take your pain medication as prescribed. Follow up with eye doctor as discussed tomorrow at 0815  RESOURCE GUIDE  Dental Problems  Patients with Medicaid: [facility] [phone] [address]                                           [phone] [address] Phone:  [phone]                                                   Phone:  [phone]  If unable to pay or uninsured, contact:  [facility] or [facility]. to become qualified for the adult dental clinic.  Chronic Pain Problems Contact [facility]  [phone] Patients need to be referred by their primary care doctor.  Insufficient Money for Medicine Contact [employer]:  call "211" or [facility] [phone].  No Primary Care Doctor Call [facility]  [phone] Other agencies that provide inexpensive medical care    [facility]  [phone]    [facility]  [phone]    [facility]  [phone]    [facility]  [phone]    [facility]  [phone]    [facility]  [phone]  Psychological Services [facility]  [phone] [facility]  [phone] [facility]   [phone] (emergency services [phone])  Abuse/Neglect [facility] Child Abuse Hotline [phone] [facility] Child Abuse Hotline [phone] (After Hours)  Emergency Shelter [facility] [phone]  [facility] [place] [facility] [phone] [place] Alert Services [phone]  MRSA Hotline #:   (671)131-8403    [facility] Resources  [facility]  [employer]                           [facility] Dept. [address] [place]                     [address]         [address] [place] [address]  Blondell Reveal Phone:   [phone]                                  Phone:  [phone]                   Phone:  [phone]  [facility] Phone:  [phone]  [facility] Child Abuse Hotline (831)488-5572 [phone] (  After Hours)

## 2011-09-10 ENCOUNTER — Encounter: Payer: Self-pay | Admitting: Family Medicine

## 2011-09-10 ENCOUNTER — Ambulatory Visit (INDEPENDENT_AMBULATORY_CARE_PROVIDER_SITE_OTHER): Payer: PRIVATE HEALTH INSURANCE | Admitting: Family Medicine

## 2011-09-10 ENCOUNTER — Ambulatory Visit (HOSPITAL_COMMUNITY)
Admission: RE | Admit: 2011-09-10 | Discharge: 2011-09-10 | Disposition: A | Discharge status: Home / Self Care | Payer: PRIVATE HEALTH INSURANCE | Source: Ambulatory Visit | Attending: Neurology | Admitting: Neurology

## 2011-09-10 ENCOUNTER — Encounter: Payer: Self-pay | Admitting: Neurology

## 2011-09-10 ENCOUNTER — Ambulatory Visit (INDEPENDENT_AMBULATORY_CARE_PROVIDER_SITE_OTHER): Payer: PRIVATE HEALTH INSURANCE | Admitting: Neurology

## 2011-09-10 VITALS — BP 117/76 | HR 70 | Temp 97.8°F | Ht 68.5 in | Wt 188.0 lb

## 2011-09-10 VITALS — BP 100/58 | HR 64 | Wt 188.0 lb

## 2011-09-10 DIAGNOSIS — G939 Disorder of brain, unspecified: Secondary | ICD-10-CM | POA: Insufficient documentation

## 2011-09-10 DIAGNOSIS — H5710 Ocular pain, unspecified eye: Secondary | ICD-10-CM

## 2011-09-10 DIAGNOSIS — G529 Cranial nerve disorder, unspecified: Secondary | ICD-10-CM | POA: Insufficient documentation

## 2011-09-10 DIAGNOSIS — R51 Headache: Secondary | ICD-10-CM

## 2011-09-10 DIAGNOSIS — H5712 Ocular pain, left eye: Secondary | ICD-10-CM

## 2011-09-10 DIAGNOSIS — R519 Headache, unspecified: Secondary | ICD-10-CM | POA: Insufficient documentation

## 2011-09-10 MED ORDER — GADOBENATE DIMEGLUMINE 529 MG/ML IV SOLN
18.0000 mL | Freq: Once | INTRAVENOUS | Status: AC | PRN
  Administered 2011-09-10: 18 mL via INTRAVENOUS

## 2011-09-10 NOTE — Progress Notes (Signed)
OFFICE VISIT  09/10/2011   CC:  Chief Complaint  Patient presents with  . Follow-up    from ER for eye pain, dizziness, nausea     HPI:    Patient is a 61 y.o. Asian male who presents for left eye pain.  His daughter Mare Loan and his interpreter Connye Burkitt are with him today. Onset 6 d/a, saw MD at ED on 09/07/11, then ophthalmologist the next day (Dr. Leland Her significant findings per ENT's note), and saw Dr. Annalee Genta in ENT 09/08/11.   Describes uncomfortable feeling in left orbital area, this was constant and progressed over the next 1-2 d to severe pain, poor focus, poor balance, nausea without vomiting.  Waxes and wanes in intensity, a bit better last 2 days,  Vicodin and zofran rx'd by EDP after visit there and these have helped some. Denies eye drooping, some tearing up when keeps eye open and gazing at something.  No runny nose. Now the peri-orbital pain has been gone and the location of the pain is the globe of the eye and a small portion of superomedial orbit.  Worse pain with closed eye, worse dizziness with open eye. Vision: described as "crooked" in left eye alone, normal in right eye alone, " double vision/messed up image" with use of both eyes.  No eye redness and no mucous.   Eye movements make pain worse.   Pain is inhibiting sleep.  No changes in skin around eye, no hearing changes. I reviewed ED record and ENT record today and CT brain was normal.  MRI brain was recommended but he decided not to do this yet.  Past Medical History  Diagnosis Date  . Diabetes mellitus 2012    "sugar a little bit up but not diabetes" is how the patient stated it (no records available).  HbA1c here in fall 2012 was UP.  Marland Kitchen Hemorrhagic, fever 1993    No residual effects  . Coronary artery disease 2011    Stents x3, put on plavix and ASA--with further questioning he apparently had no symptoms but he told an MD in [place] that he had some SOB at times and the MD put the stents in as a PROPHYLAXIS  against CAD (pt says actual ischemia or CAD was NOT confirmed--no records available.  I encouraged patient to go ahead and d/c plavix after he had been on it for one year but he insists on continuing it.  . Tobacco dependence     1/2 ppd x 40 yrs    Past Surgical History  Procedure Date  . Appendectomy age 58  . Esophagogastroduodenoscopy     Done for upper abd pains: normal per pt report.  . Nm myoview ltd 04/2011     (Dr. Eden Emms) Negative except hypertensive response to exercise.  . Cardiac catheterization     Outpatient Prescriptions Prior to Visit  Medication Sig Dispense Refill  . aspirin 81 MG tablet Take 81 mg by mouth daily.        Marland Kitchen atorvastatin (LIPITOR) 20 MG tablet Take 1 tablet (20 mg total) by mouth daily.  30 tablet  11  . glucose blood (FREESTYLE LITE) test strip Check glucose twice per day  100 each  5  . HYDROcodone-acetaminophen (VICODIN) 5-500 MG per tablet Take 1 tablet by mouth every 4 (four) hours as needed for pain.  15 tablet  0  . insulin aspart (NOVOLOG) 100 UNIT/ML injection Inject 16 Units into the skin 3 (three) times daily before meals.      Marland Kitchen  insulin glargine (LANTUS) 100 UNIT/ML injection Inject 20 Units into the skin at bedtime.  5 pen  5  . Insulin Pen Needle (B-D UF III MINI PEN NEEDLES) 31G X 5 MM MISC Use as directed for insulin injections  30 each  2  . Lancets (FREESTYLE) lancets Check glucose twice daily  100 each  5  . losartan (COZAAR) 100 MG tablet Take 1 tablet (100 mg total) by mouth daily.  30 tablet  3  . nitroGLYCERIN (NITROSTAT) 0.4 MG SL tablet Place 1 tablet (0.4 mg total) under the tongue every 5 (five) minutes as needed for chest pain.  30 tablet  0  . omeprazole (PRILOSEC) 20 MG capsule Take 1 capsule (20 mg total) by mouth daily.  30 capsule  11  . ondansetron (ZOFRAN) 4 MG tablet Take 1 tablet (4 mg total) by mouth every 6 (six) hours.  12 tablet  0  Acyclovir PO per ophthalmologist  Allergies  Allergen Reactions  . Amoxicillin  Itching and Rash    swollen hands    ROS As per HPI  PE: Blood pressure 117/76, pulse 70, temperature 97.8 F (36.6 C), temperature source Temporal, height 5' 8.5" (1.74 m), weight 188 lb (85.276 kg). Gen: Alert, sitting on exam table with left eye closed and hand placed over the eye.  NAD.  Patient is oriented to person, place, time, and situation. HEENT: ENT: Ears: EACs clear, normal epithelium.  TMs with good light reflex and landmarks bilaterally.  Eyes: no injection, icteris, swelling, or exudate.  EOMI, PERRLA.  Fundoscopy difficult due to very tiny pupils but portions of retinal vasculature and optic discs visualized appeared normal. Nose: no drainage or turbinate edema/swelling.  No injection or focal lesion.  Mouth: lips without lesion/swelling.  Oral mucosa pink and moist.  Dentition intact and without obvious caries or gingival swelling.  Oropharynx without erythema, exudate, or swelling.  Neck - No masses or thyromegaly or limitation in range of motion CV: RRR, no m/r/g.   LUNGS: CTA bilat, nonlabored resps, good aeration in all lung fields.  LABS:  none  IMPRESSION AND PLAN:  Eye pain Acute left eye pain and periorbital headache of unclear etiology. Workup so far has not revealed any abnormality on exam or CT of brain. Ophthalmologist empirically started acyclovir for possible early HSV involving the eye. My thoughts are possible atypical/new onset cluster HA's (of note his eye pain did improve significantly with 50% oxygen in office today for 5 min.) and atypical trigeminal neuralgia.   I discussed the case with Dr. Modesto Charon, [facility] Neurologist, and he graciously agreed to see this patient today for urgent consultation. He did also encourage an MRI brain be done to eval for brainstem pathology.  Pt has been resistant to this b/c of cost but we encouraged him to consider it further and talk with hospital about payment plans, etc.    FOLLOW UP: Return if symptoms worsen or  fail to improve.

## 2011-09-10 NOTE — Assessment & Plan Note (Signed)
Acute left eye pain and periorbital headache of unclear etiology. Workup so far has not revealed any abnormality on exam or CT of brain. Ophthalmologist empirically started acyclovir for possible early HSV involving the eye. My thoughts are possible atypical/new onset cluster HA's (of note his eye pain did improve significantly with 50% oxygen in office today for 5 min.) and atypical trigeminal neuralgia.   I discussed the case with Dr. Modesto Charon, [facility] Neurologist, and he graciously agreed to see this patient today for urgent consultation. He did also encourage an MRI brain be done to eval for brainstem pathology.  Pt has been resistant to this b/c of cost but we encouraged him to consider it further and talk with hospital about payment plans, etc.

## 2011-09-10 NOTE — Progress Notes (Signed)
Dear Dr. Milinda Cave,  Thank you for having me see Lawrence Randall in consultation today at [facility] Neurology for his problem with left eye pain  As you may recall, he is a 61 y.o. year old male with a history of diabetes, tobacco abuse and CAD who presents with a weak of progressive left eye pain.  He was initially seen in the ED as well as by ophthalmology and ENT with no clear abnormality found.  CT head was unremarkable.  The patient endorses some numbness of the face, as well as double vision, that is binocular.  No swallowing, or speaking difficulties, no droop of the face, no hearing changes.  He has not been taking any pain medicine.  The pain is continuous.  He has had no fevers or chills.  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  No photophobia, no rhinorrhea, no conjunctival injection.  Past Medical History  Diagnosis Date  . Diabetes mellitus 2012    "sugar a little bit up but not diabetes" is how the patient stated it (no records available).  HbA1c here in fall 2012 was UP.  Marland Kitchen Hemorrhagic, fever 1993    No residual effects  . Coronary artery disease 2011    Stents x3, put on plavix and ASA--with further questioning he apparently had no symptoms but he told an MD in [place] that he had some SOB at times and the MD put the stents in as a PROPHYLAXIS against CAD (pt says actual ischemia or CAD was NOT confirmed--no records available.  I encouraged patient to go ahead and d/c plavix after he had been on it for one year but he insists on continuing it.  . Tobacco dependence     1/2 ppd x 40 yrs    Past Surgical History  Procedure Date  . Appendectomy age 27  . Esophagogastroduodenoscopy     Done for upper abd pains: normal per pt report.  . Nm myoview ltd 04/2011     (Dr. Eden Emms) Negative except hypertensive response to exercise.  . Cardiac catheterization     History   Social History  . Marital Status: Married    Spouse Name: N/A    Number of Children: N/A  . Years of  Education: N/A   Social History Main Topics  . Smoking status: Current Everyday Smoker -- 0.5 packs/day for 40 years    Types: Cigarettes  . Smokeless tobacco: Never Used   Comment: counseling via interpreter  . Alcohol Use: No  . Drug Use: No  . Sexually Active: Not on file   Other Topics Concern  . 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  Occasional beer.  No drug use.Exercise: fast walking most days about 2mi.Eats sensible diet.    Family History  Problem Relation Age of Onset  . Heart disease Mother     chf  . Diabetes Mother     Current Outpatient Prescriptions on File Prior to Visit  Medication Sig Dispense Refill  . aspirin 81 MG tablet Take 81 mg by mouth daily.        Marland Kitchen atorvastatin (LIPITOR) 20 MG tablet Take 1 tablet (20 mg total) by mouth daily.  30 tablet  11  . glucose blood (FREESTYLE LITE) test strip Check glucose twice per day  100 each  5  . HYDROcodone-acetaminophen (VICODIN) 5-500 MG per tablet Take 1 tablet by mouth every 4 (four) hours as needed for pain.  15 tablet  0  . insulin aspart (NOVOLOG) 100 UNIT/ML injection Inject 16 Units into the skin 3 (three) times daily before meals.      . insulin glargine (LANTUS) 100 UNIT/ML injection Inject 20 Units into the skin at bedtime.  5 pen  5  . Insulin Pen Needle (B-D UF III MINI PEN NEEDLES) 31G X 5 MM MISC Use as directed for insulin injections  30 each  2  . Lancets (FREESTYLE) lancets Check glucose twice daily  100 each  5  . losartan (COZAAR) 100 MG tablet Take 1 tablet (100 mg total) by mouth daily.  30 tablet  3  . nitroGLYCERIN (NITROSTAT) 0.4 MG SL tablet Place 1 tablet (0.4 mg total) under the tongue every 5 (five) minutes as needed for chest pain.  30 tablet  0  . omeprazole (PRILOSEC) 20 MG capsule Take 1 capsule (20 mg total) by mouth daily.   30 capsule  11  . ondansetron (ZOFRAN) 4 MG tablet Take 1 tablet (4 mg total) by mouth every 6 (six) hours.  12 tablet  0   No current facility-administered medications on file prior to visit.    Allergies  Allergen Reactions  . Amoxicillin Itching and Rash    swollen hands      ROS:  13 systems were reviewed and  are unremarkable.   Examination:  Filed Vitals:   09/10/11 1151  BP: 100/58  Pulse: 64  Weight: 188 lb (85.276 kg)     In general, clearly in pain, holding right eye.  Cardiovascular: The patient has a regular rate and rhythm and no carotid bruits.  Fundoscopy:  Disks are flat. Vessel caliber within normal limits.  Mental status:   The patient is oriented to person, place and time. Recent and remote memory are intact. Attention span and concentration are normal. Language including repetition, naming, following commands are intact. Fund of knowledge of current and historical events, as well as vocabulary are normal.  Cranial Nerves: Pupils are equally round and reactive to light(no horners) Visual fields full to confrontation. EOMs reveal a hypertropia of the left eye with disconjugacy worse in right down gze. ?Facial sensation decreased in left upper face.Corneals reduced on left.   Muscles of facial expression are symmetric. Hearing intact to bilateral finger rub. Tongue protrusion, uvula, palate midline.  Shoulder shrug intact  Motor:  The patient has normal bulk and tone, no pronator drift.  There are no adventitious movements.  5/5 muscle strength bilaterally.  Reflexes:  Symmetric quiet.  Toes down  Coordination:  Normal finger to nose.  No dysdiadokinesia.  Sensation is intact to light touch symmetrically.  Gait and Station are normal.  Tandem gait is intact.  Romberg is negative  CT head was reviewed.  Seems to show isodense lesion to brain in sellar region on left.  Impression/Recs: 1.  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   Likely cavernous sinus or superior orbital fissure lesion on left.  MRI brain with thin cuts through brainstem with and without contrast, as well as orbital studies.  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  I will see patient tomorrow and refer them to a skull base/pituitary surgeon at an academic center.   Thank you for having [place] see Lawrence Randall in consultation.  Feel free to contact me with any questions.  Lupita Raider Modesto Charon, MD [facility] Neurology, [place] [address] [place], [place] [address] Phone: [phone] Fax: [phone].

## 2011-09-10 NOTE — Patient Instructions (Addendum)
Your MRI is scheduled at the [facility] right beside [facility]. The address is [address] [place]. Your appointment is Wednesday, July 10th at 9:00 pm. Please arrive 15 minutes prior to your scheduled appointment.   [phone].

## 2011-09-11 ENCOUNTER — Encounter: Payer: Self-pay | Admitting: Neurology

## 2011-09-11 ENCOUNTER — Ambulatory Visit (INDEPENDENT_AMBULATORY_CARE_PROVIDER_SITE_OTHER): Payer: PRIVATE HEALTH INSURANCE | Admitting: Neurology

## 2011-09-11 ENCOUNTER — Other Ambulatory Visit: Payer: Self-pay | Admitting: Neurology

## 2011-09-11 VITALS — BP 100/60 | HR 64

## 2011-09-11 DIAGNOSIS — D497 Neoplasm of unspecified behavior of endocrine glands and other parts of nervous system: Secondary | ICD-10-CM

## 2011-09-11 DIAGNOSIS — D352 Benign neoplasm of pituitary gland: Secondary | ICD-10-CM

## 2011-09-11 NOTE — Progress Notes (Signed)
I saw Mr. Gatt back after his MRI brain.  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  I am going to refer him to the [facility] program for treatment.  He will continue to use his hydrocodone/acetaminophen for treatment.  Over 50% of the 25 minute appointment was spent counseling the patient.  Lupita Raider Modesto Charon, MD [facility], [place]

## 2011-09-12 ENCOUNTER — Telehealth: Payer: Self-pay | Admitting: Neurology

## 2011-09-12 ENCOUNTER — Ambulatory Visit: Payer: PRIVATE HEALTH INSURANCE | Admitting: Family Medicine

## 2011-09-12 NOTE — Telephone Encounter (Signed)
Call received from [facility] at Dr. Santiago Glad office at [facility] re: appointment for Mr. Guimond scheduled for Tuesday, July 16th at 3:00 pm. They left the interpreter, Connye Burkitt a message as well. Dr. Santiago Glad office number is [phone].I told Bonita Quin that I would send Connye Burkitt an email with this information as well and ask that she call her and confirm the appointment. I then called and spoke with Connye Burkitt. Info given. Asked that she call Bonita Quin to confirm the appointment. She states she will. Email sent as well.

## 2011-09-16 ENCOUNTER — Ambulatory Visit (INDEPENDENT_AMBULATORY_CARE_PROVIDER_SITE_OTHER): Payer: PRIVATE HEALTH INSURANCE | Admitting: Family Medicine

## 2011-09-16 ENCOUNTER — Encounter: Payer: Self-pay | Admitting: Family Medicine

## 2011-09-16 ENCOUNTER — Ambulatory Visit: Payer: PRIVATE HEALTH INSURANCE | Admitting: Neurology

## 2011-09-16 VITALS — BP 136/75 | HR 65 | Temp 96.5°F | Ht 68.5 in | Wt 188.0 lb

## 2011-09-16 DIAGNOSIS — I10 Essential (primary) hypertension: Secondary | ICD-10-CM

## 2011-09-16 DIAGNOSIS — D352 Benign neoplasm of pituitary gland: Secondary | ICD-10-CM

## 2011-09-16 NOTE — Assessment & Plan Note (Signed)
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 Due to increased pain and dizziness when he uses both eyes, I have covered his left eye with a soft gauze patch that will hopefully provide some comfort and keep him from utilizing the left eye for now.   He has vicodin and zofran to take prn, and he goes to [facility] this afternoon to see Dr. Lavonna Monarch in the Neurosurgery dept.

## 2011-09-16 NOTE — Progress Notes (Addendum)
OFFICE NOTE  09/16/2011  CC:  Chief Complaint  Patient presents with  . Follow-up    DM, HTN     HPI: Patient is a 61 y.o. 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  He has appt with [facility] neurosurgeon this afternoon.  Not monitoring glucoses the last week due to pain/dizziness/nausea related to his pituitary mass that is invading his left cavernous sinus. They had a few questions about the tumor itself, about the surgery and any med changes.    Pertinent PMH:  Past Medical History  Diagnosis Date  . Diabetes mellitus 2012    "sugar a little bit up but not diabetes" is how the patient stated it (no records available).  HbA1c here in fall 2012 was UP.  Marland Kitchen Hemorrhagic, fever 1993    No residual effects  . Coronary artery disease 2011    Stents x3, put on plavix and ASA--with further questioning he apparently had no symptoms but he told an MD in [place] that he had some SOB at times and the MD put the stents in as a PROPHYLAXIS against CAD (pt says actual ischemia or CAD was NOT confirmed--no records available.  I encouraged patient to go ahead and d/c plavix after he had been on it for one year but he insists on continuing it.  . Tobacco dependence     1/2 ppd x 40 yrs    MEDS:  Outpatient Prescriptions Prior to Visit  Medication Sig Dispense Refill  . aspirin 81 MG tablet Take 81 mg by mouth daily.        Marland Kitchen atorvastatin (LIPITOR) 20 MG tablet Take 1 tablet (20 mg total) by mouth daily.  30 tablet  11  . glucose blood (FREESTYLE LITE) test strip Check glucose twice per day  100 each  5  . HYDROcodone-acetaminophen (VICODIN) 5-500 MG per tablet Take 1 tablet by mouth every 4 (four) hours as needed for pain.  15 tablet  0  . insulin aspart (NOVOLOG) 100 UNIT/ML injection Inject 16 Units into the skin 3 (three) times daily before meals.      .  insulin glargine (LANTUS) 100 UNIT/ML injection Inject 20 Units into the skin at bedtime.  5 pen  5  . Insulin Pen Needle (B-D UF III MINI PEN NEEDLES) 31G X 5 MM MISC Use as directed for insulin injections  30 each  2  . Lancets (FREESTYLE) lancets Check glucose twice daily  100 each  5  . losartan (COZAAR) 100 MG tablet Take 1 tablet (100 mg total) by mouth daily.  30 tablet  3  . nitroGLYCERIN (NITROSTAT) 0.4 MG SL tablet Place 1 tablet (0.4 mg total) under the tongue every 5 (five) minutes as needed for chest pain.  30 tablet  0  . omeprazole (PRILOSEC) 20 MG capsule Take 1 capsule (20 mg total) by mouth daily.  30 capsule  11  Zofran tabs for nausea  PE: Blood pressure 136/75, pulse 65, temperature 96.5 F (35.8 C), temperature source Temporal, height 5' 8.5" (1.74 m), weight 188 lb (85.276 kg), SpO2 97.00%. Gen: Alert, well appearing, but holds left eye in squinted position with a look of discomfort on his face.  Patient is oriented to person, place, time, and situation. CN 2-12 intact grossly.  When each eye is tested separately, he has no visual field deficit.  With both eyes tested together, he has trouble visualizing things in lower left visual field.  Corneal reflexes intact bilat.  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 Due to increased pain and dizziness when he uses both eyes, I have covered his left eye with a soft gauze patch that will hopefully provide some comfort and keep him from utilizing the left eye for now.   He has vicodin and zofran to take prn, and he goes to [facility] this afternoon to see Dr. Lavonna Monarch in the Neurosurgery dept.  HTN (hypertension), benign Problem stable.  Continue current medications and diet appropriate for this condition.  We have reviewed our general long term plan for this problem and also reviewed symptoms and signs that should prompt the patient to call or return to  the office.   Type II or unspecified type diabetes mellitus without mention of complication, uncontrolled Control has been improving. He has been compliant with meds but not checking sugars lately b/c eye discomfort being so bothersome.   Gave pt the most recent cardiology office note and Dr. Nash Dimmer office note to take with him to [facility].  FOLLOW UP: to be arranged after [facility] eval and subsequent surgical procedure.

## 2011-09-16 NOTE — Assessment & Plan Note (Signed)
Problem stable.  Continue current medications and diet appropriate for this condition.  We have reviewed our general long term plan for this problem and also reviewed symptoms and signs that should prompt the patient to call or return to the office.  

## 2011-09-16 NOTE — Assessment & Plan Note (Signed)
Control has been improving. He has been compliant with meds but not checking sugars lately b/c eye discomfort being so bothersome.

## 2011-09-29 ENCOUNTER — Ambulatory Visit (INDEPENDENT_AMBULATORY_CARE_PROVIDER_SITE_OTHER): Payer: PRIVATE HEALTH INSURANCE | Admitting: Family Medicine

## 2011-09-29 ENCOUNTER — Encounter: Payer: Self-pay | Admitting: Family Medicine

## 2011-09-29 VITALS — BP 138/80 | HR 73 | Ht 68.5 in | Wt 190.0 lb

## 2011-09-29 DIAGNOSIS — D352 Benign neoplasm of pituitary gland: Secondary | ICD-10-CM

## 2011-09-29 NOTE — Progress Notes (Signed)
OFFICE NOTE  09/29/2011  CC:  Chief Complaint  Patient presents with  . Follow-up    Pituitary macroadenoma     HPI: Patient is a 61 y.o. Asian male who is here for two week f/u DM 2.  He saw the specialist at [facility] for his pituitary macroadenoma. He was put on prednisone 2.5mg  daily and dostinex 0.25mg  twice per week. His HA and eye pain already feel better.  His vision is improved, although he still has a bit of double vision with binocular vision. Glucoses up: 150s-300s postprandial.  Fastings up as well.  Pertinent PMH:  Past Medical History  Diagnosis Date  . Diabetes mellitus 2012    "sugar a little bit up but not diabetes" is how the patient stated it (no records available).  HbA1c here in fall 2012 was UP.  Marland Kitchen Hemorrhagic, fever 1993    No residual effects  . Coronary artery disease 2011    Stents x3, put on plavix and ASA--with further questioning he apparently had no symptoms but he told an MD in [place] that he had some SOB at times and the MD put the stents in as a PROPHYLAXIS against CAD (pt says actual ischemia or CAD was NOT confirmed--no records available.  I encouraged patient to go ahead and d/c plavix after he had been on it for one year but he insists on continuing it.  . Tobacco dependence     1/2 ppd x 40 yrs  . 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 Nash Dimmer exam).--sees [facility] neurosurgeon 09/16/11.   Past surgical, social, and family history reviewed and no changes noted since last office visit.  MEDS:  Outpatient Prescriptions Prior to Visit  Medication Sig Dispense Refill  . aspirin 81 MG tablet Take 81 mg by mouth daily.        Marland Kitchen atorvastatin (LIPITOR) 20 MG tablet Take 1 tablet (20 mg total) by mouth daily.  30 tablet  11  . glucose blood (FREESTYLE LITE) test strip Check glucose twice per day  100 each  5  . insulin aspart (NOVOLOG) 100 UNIT/ML injection Inject 16 Units  into the skin 3 (three) times daily before meals.      . insulin glargine (LANTUS) 100 UNIT/ML injection Inject 20 Units into the skin at bedtime.  5 pen  5  . Insulin Pen Needle (B-D UF III MINI PEN NEEDLES) 31G X 5 MM MISC Use as directed for insulin injections  30 each  2  . Lancets (FREESTYLE) lancets Check glucose twice daily  100 each  5  . losartan (COZAAR) 100 MG tablet Take 1 tablet (100 mg total) by mouth daily.  30 tablet  3  . nitroGLYCERIN (NITROSTAT) 0.4 MG SL tablet Place 1 tablet (0.4 mg total) under the tongue every 5 (five) minutes as needed for chest pain.  30 tablet  0  . omeprazole (PRILOSEC) 20 MG capsule Take 1 capsule (20 mg total) by mouth daily.  30 capsule  11    PE: Blood pressure 138/80, pulse 73, height 5' 8.5" (1.74 m), weight 190 lb (86.183 kg). Gen: Alert, well appearing.  Patient is oriented to person, place, time, and situation. He is not squinting his left eye tightly like in prior exam.  He looks comfortable. CN 2-12 grossly intact bilaterally.  Visual field testing shows deficits laterally in both eyes (double vision).  IMPRESSION AND PLAN:  Type II or unspecified type  diabetes mellitus without mention of complication, uncontrolled Poor control recently due to systemic steroids and acute stress of illness lately. Discussed up-titration of his insulins for the time being: Lantus up to 25 U qhs and novolog up to 20 U qAC.  Pituitary macroadenoma Improved on new meds that [facility] neurosurgeon started him on. He has f/u next week with him.  We'll try to get this doctor's office note.     FOLLOW UP: 3 wks

## 2011-09-30 DIAGNOSIS — Z0279 Encounter for issue of other medical certificate: Secondary | ICD-10-CM

## 2011-09-30 NOTE — Assessment & Plan Note (Signed)
Improved on new meds that [facility] neurosurgeon started him on. He has f/u next week with him.  We'll try to get this doctor's office note.

## 2011-09-30 NOTE — Assessment & Plan Note (Signed)
Poor control recently due to systemic steroids and acute stress of illness lately. Discussed up-titration of his insulins for the time being: Lantus up to 25 U qhs and novolog up to 20 U qAC.

## 2011-10-20 ENCOUNTER — Ambulatory Visit: Payer: PRIVATE HEALTH INSURANCE | Admitting: Family Medicine

## 2012-01-08 ENCOUNTER — Telehealth: Payer: Self-pay | Admitting: Cardiovascular Disease

## 2012-01-08 NOTE — Telephone Encounter (Signed)
New Problem:     I called the patient and was unable to reach them. I left a message on their voicemail with my name, the reason I called, the name of his physician, and a number to call back to schedule their appointment. 

## 2012-04-06 ENCOUNTER — Other Ambulatory Visit: Payer: Self-pay | Admitting: Family Medicine

## 2012-04-07 NOTE — Telephone Encounter (Signed)
RX denied.  Pt is no longer in country.  Pt no longer taking this medication per med list.

## 2012-09-09 ENCOUNTER — Other Ambulatory Visit: Payer: Self-pay

## 2012-11-23 ENCOUNTER — Encounter: Payer: Self-pay | Admitting: Cardiovascular Disease

## 2012-12-23 ENCOUNTER — Other Ambulatory Visit: Payer: Self-pay | Admitting: Family Medicine

## 2012-12-23 NOTE — Telephone Encounter (Signed)
Patient no longer in this country.

## 2013-02-02 IMAGING — US US AORTA SCREENING (MEDICARE)
1 series · 14 of 24 positions shown · non-contrast
Comparison: None.

CLINICAL DATA: Pulsatile abdomen, tobacco use

ABDOMINAL AORTA SCREENING ULTRASOUND
TECHNIQUE: Ultrasound examination of the abdominal aorta was
performed as a screening evaluation for abdominal aortic aneurysm.

[Series 1: us aorta screening (medicare) · 0.37mm/px · 14 of 24 slices shown]
[im 1/24]
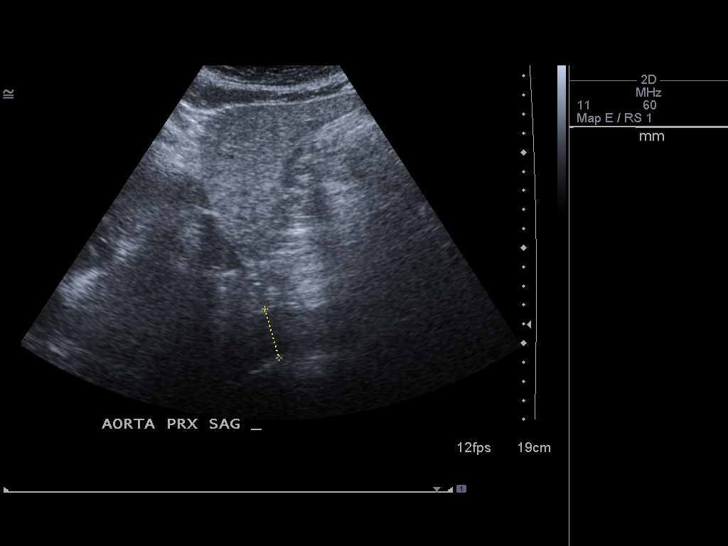
[im 3/24]
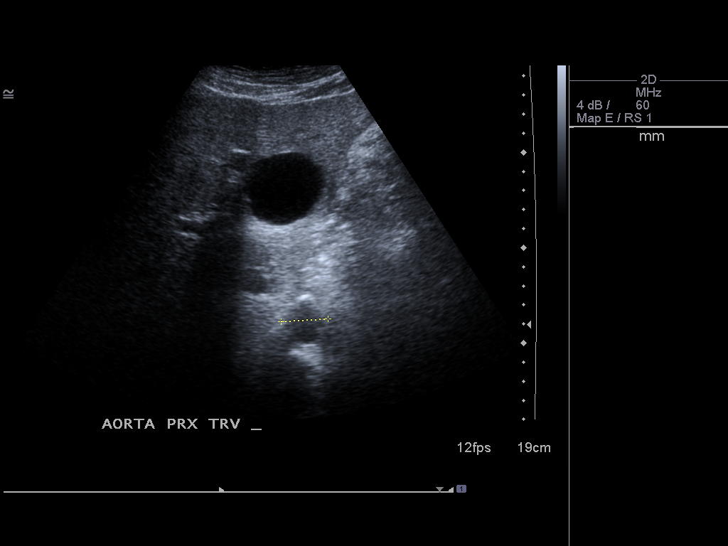
[im 5/24]
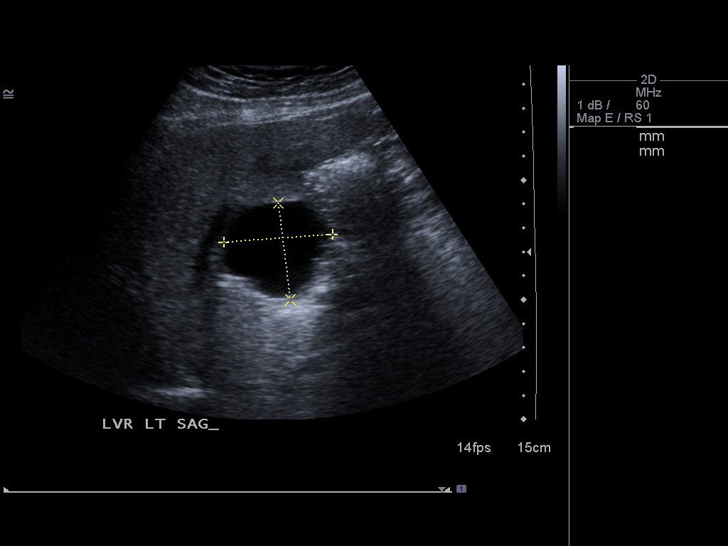
[im 7/24]
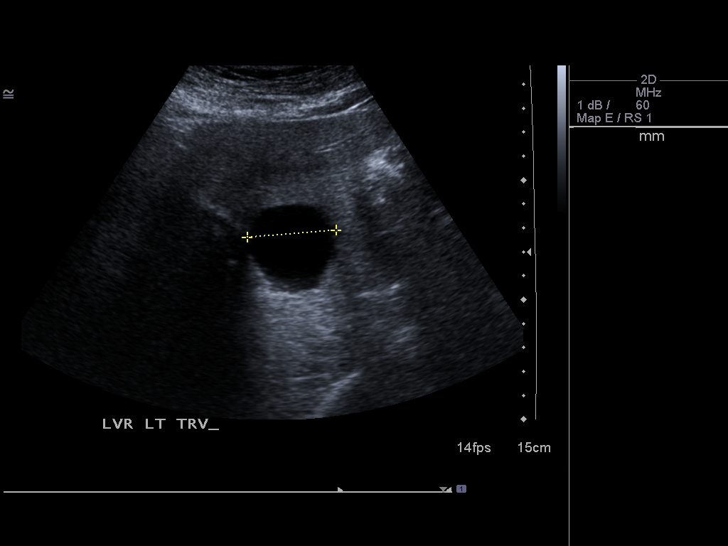
[im 8/24]
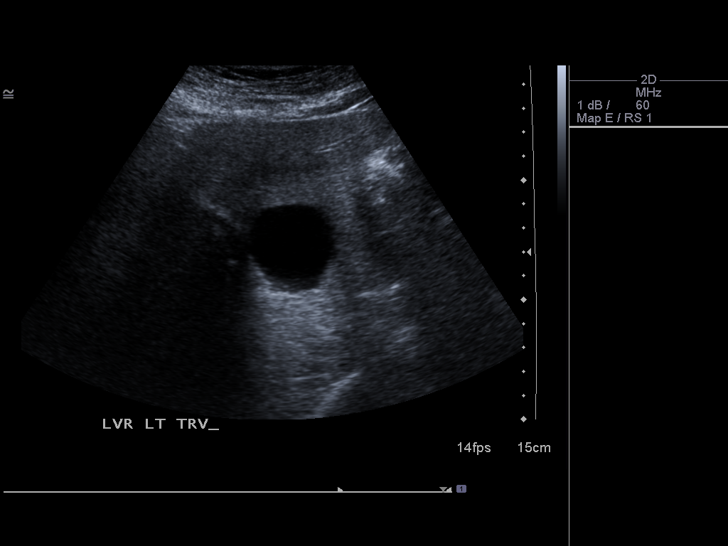
[im 10/24]
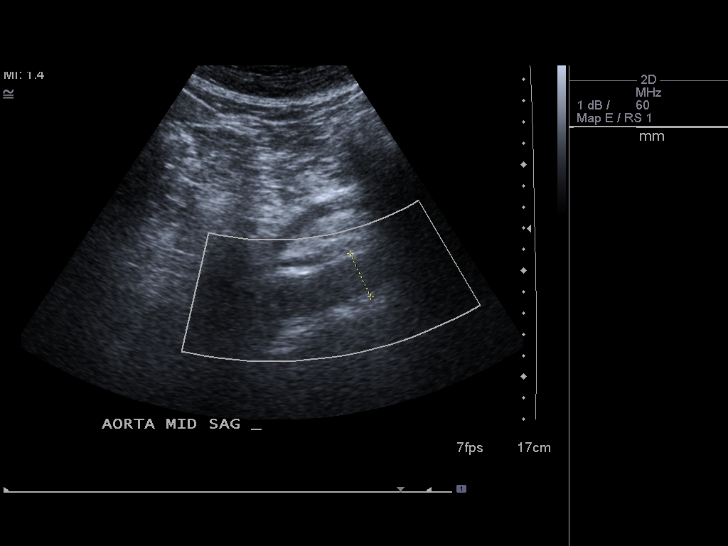
[im 12/24]
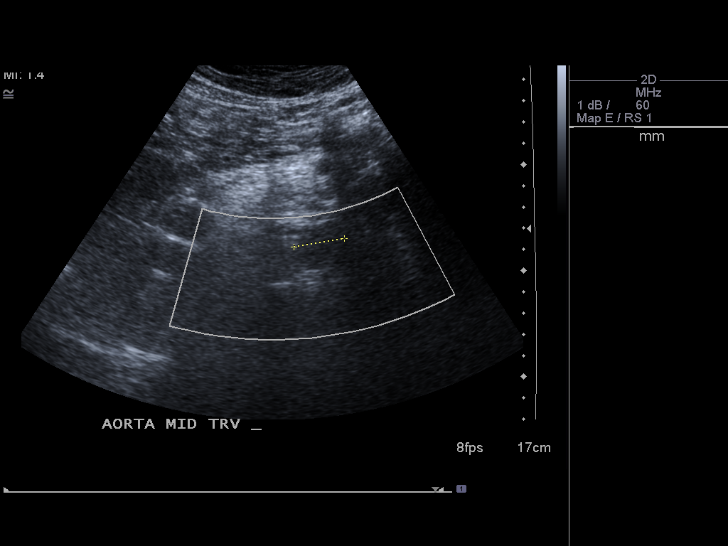
[im 13/24]
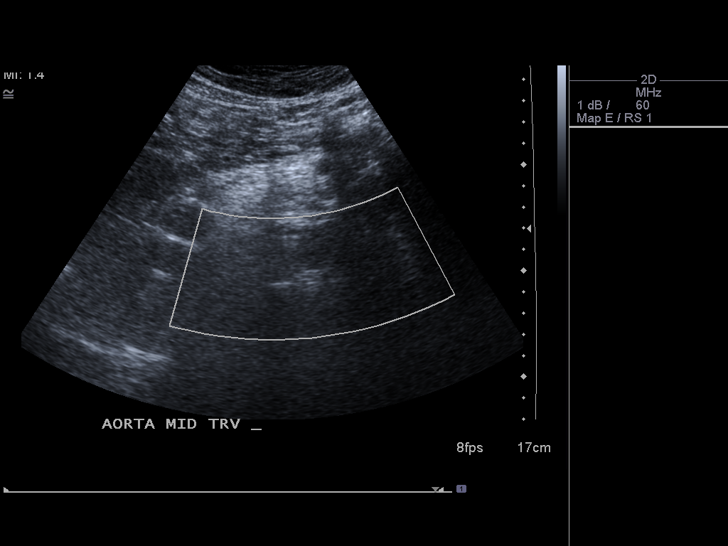
[im 15/24]
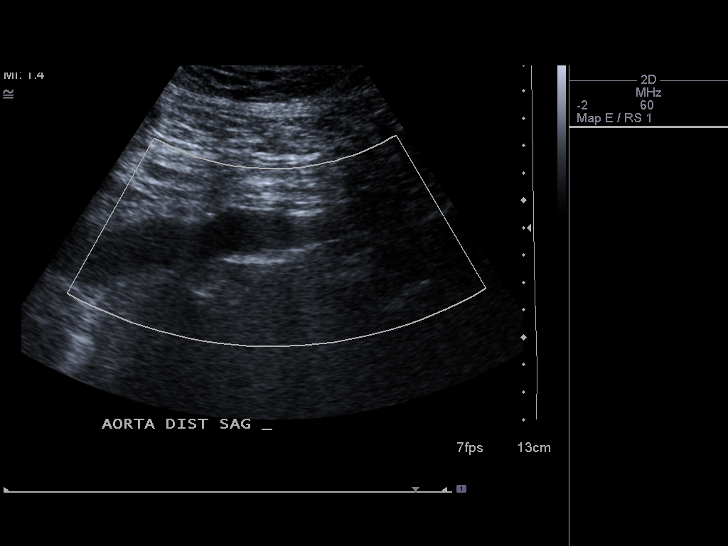
[im 17/24]
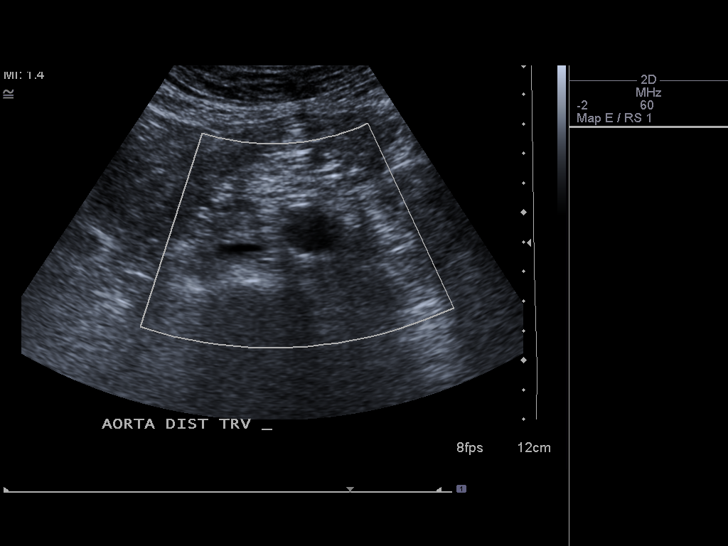
[im 19/24]
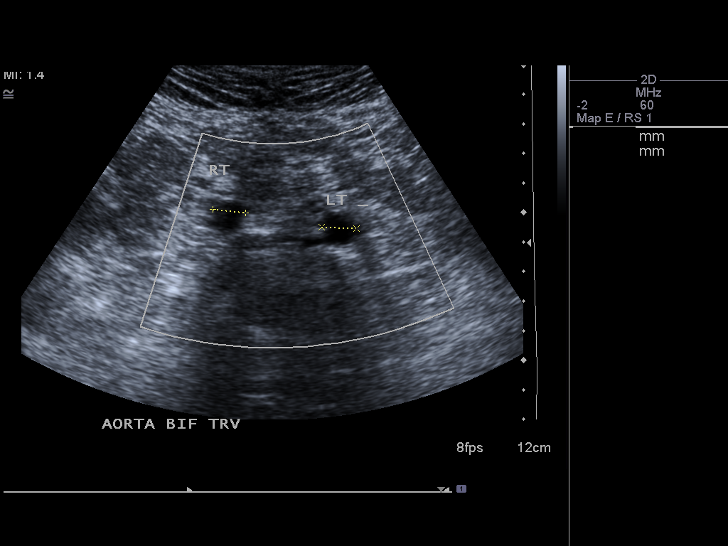
[im 20/24]
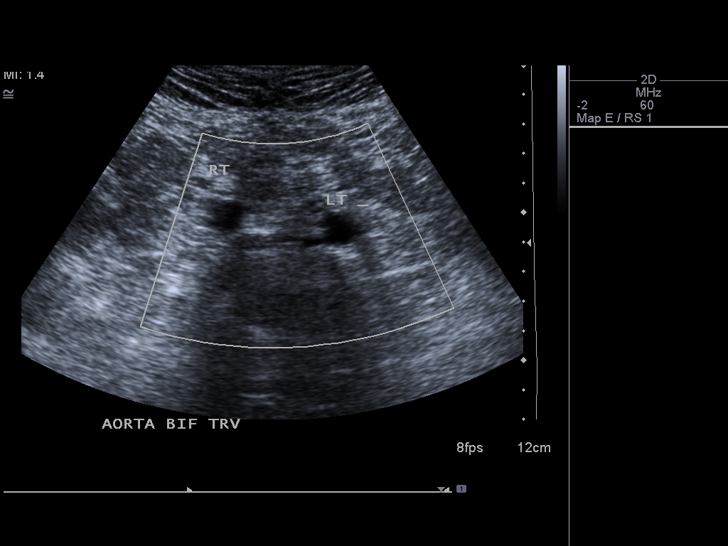
[im 22/24]
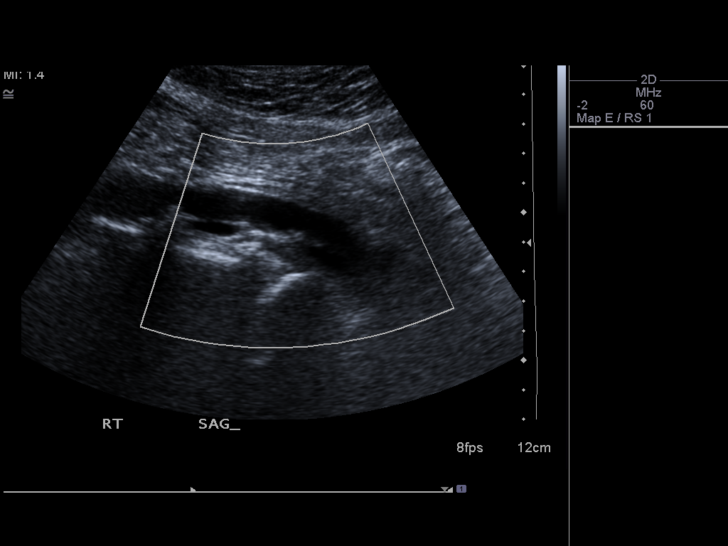
[im 24/24]
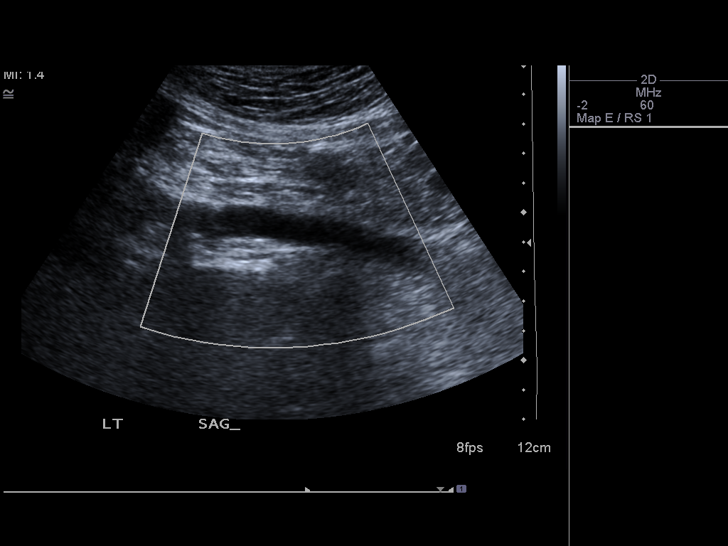

[14 of 24 positions shown; findings below may reference images not displayed]

Abdominal Aorta: There is a 37 x 41 x 46 mm simple appearing cyst
in the left hepatic segment.  Ectatic atheromatous abdominal aorta,
segments obscured by overlying bowel gas.

      Maximum AP diameter:  2.7 cm.
      Maximum TRV diameter:  2.5 cm.
IMPRESSION: 1.No abdominal aortic aneurysm identified.
2. Benign appearing left hepatic cyst.

## 2013-03-10 ENCOUNTER — Ambulatory Visit (INDEPENDENT_AMBULATORY_CARE_PROVIDER_SITE_OTHER): Payer: PRIVATE HEALTH INSURANCE | Admitting: Family Medicine

## 2013-03-10 ENCOUNTER — Encounter: Payer: Self-pay | Admitting: Family Medicine

## 2013-03-10 VITALS — BP 147/88 | HR 64 | Temp 98.7°F | Resp 18 | Ht 68.5 in | Wt 183.0 lb

## 2013-03-10 DIAGNOSIS — M25569 Pain in unspecified knee: Secondary | ICD-10-CM

## 2013-03-10 DIAGNOSIS — D353 Benign neoplasm of craniopharyngeal duct: Secondary | ICD-10-CM

## 2013-03-10 DIAGNOSIS — I10 Essential (primary) hypertension: Secondary | ICD-10-CM

## 2013-03-10 DIAGNOSIS — IMO0001 Reserved for inherently not codable concepts without codable children: Secondary | ICD-10-CM

## 2013-03-10 DIAGNOSIS — M25561 Pain in right knee: Secondary | ICD-10-CM

## 2013-03-10 DIAGNOSIS — D352 Benign neoplasm of pituitary gland: Secondary | ICD-10-CM

## 2013-03-10 DIAGNOSIS — E785 Hyperlipidemia, unspecified: Secondary | ICD-10-CM

## 2013-03-10 DIAGNOSIS — E1165 Type 2 diabetes mellitus with hyperglycemia: Principal | ICD-10-CM

## 2013-03-10 LAB — BASIC METABOLIC PANEL
BUN: 22 mg/dL (ref 6–23)
CALCIUM: 9 mg/dL (ref 8.4–10.5)
CO2: 28 meq/L (ref 19–32)
Chloride: 105 mEq/L (ref 96–112)
Creatinine, Ser: 0.8 mg/dL (ref 0.4–1.5)
GFR: 98.22 mL/min (ref >60.00)
GLUCOSE: 92 mg/dL (ref 70–99)
POTASSIUM: 4 meq/L (ref 3.5–5.1)
SODIUM: 140 meq/L (ref 135–145)

## 2013-03-10 LAB — MICROALBUMIN / CREATININE URINE RATIO
CREATININE, U: 81.2 mg/dL
MICROALB UR: 1 mg/dL (ref 0.0–1.9)
MICROALB/CREAT RATIO: 1.2 mg/g (ref 0.0–30.0)

## 2013-03-10 LAB — HEMOGLOBIN A1C: Hgb A1c MFr Bld: 7.1 % — ABNORMAL HIGH (ref 4.6–6.5)

## 2013-03-10 MED ORDER — MELOXICAM 15 MG PO TABS: ORAL_TABLET | ORAL | Status: AC

## 2013-03-10 NOTE — Progress Notes (Signed)
OFFICE NOTE  03/10/2013  CC:  Chief Complaint  Patient presents with  . Diabetes     HPI: Patient is a 63 y.o. Asian male who is here for follow up with interpreter Sherre Lain.  I have not seen him in about 49mo. He went back to [place] and has been back in the [place] for about 3 mo now, living with daughter again. Says he is on aspirin, a med for cholesterol, a med for HTN, and an insulin that he takes "10 units of 4 times per day" per interpreter.  He has none of these with him today and says he knows only the [place] chinese names of them.  He ran out of these 3-4 days ago.  Says he got CPE with labs in [place] 08/2012 and "everything was good".  Home glucs: fastings <120.  2H PP 160-260. No home bp numbers to report. Still smoking cigs.  He has apparently still been on the medication [facility] neurosurg put him on for his pituitary macroadenoma but he has not followed up with them.  Complains of hx of right knee pain, several years on and off, most recently for the last 2 wks.  Hurts in anterior and posterior region, with some mild swelling in knee and right LL at times per pt.  No injury recalled, no trauma. Hurts with deep bending or full extension.  No giving way or locking up. Says he got some injections in it about 2 yrs ago in [place].  Pertinent PMH:  Past Medical History  Diagnosis Date  . Diabetes mellitus 2012  . Hemorrhagic, fever 1993    No residual effects  . Coronary artery disease 2011    Stents x3, put on plavix and ASA--with further questioning he apparently had no symptoms but he told an MD in [place] that he had some SOB at times and the MD put the stents in as a PROPHYLAXIS against CAD (pt says actual ischemia or CAD was NOT confirmed--no records available.  I encouraged patient to go ahead and d/c plavix after he had been on it for one year but he insists on continuing it.  . Tobacco dependence     1/2 ppd x 40 yrs  . Pituitary macroadenoma with extrasellar extension 09/2011   left cavernous sinus involvement (+cranial nerve palsies on Dr. Jodi Mourning exam).--saw [facility] neurosurgeon 09/16/11.    MEDS:  Outpatient Prescriptions Prior to Visit  Medication Sig Dispense Refill  . aspirin 81 MG tablet Take 81 mg by mouth daily.        Marland Kitchen atorvastatin (LIPITOR) 20 MG tablet Take 1 tablet (20 mg total) by mouth daily.  30 tablet  11  . cabergoline (DOSTINEX) 0.5 MG tablet Take 0.25 mg by mouth 2 (two) times a week.      . insulin aspart (NOVOLOG) 100 UNIT/ML injection Inject 16 Units into the skin 3 (three) times daily before meals.      . insulin glargine (LANTUS) 100 UNIT/ML injection Inject 20 Units into the skin at bedtime.  5 pen  5  . Insulin Pen Needle (B-D UF III MINI PEN NEEDLES) 31G X 5 MM MISC Use as directed for insulin injections  30 each  2  . losartan (COZAAR) 100 MG tablet Take 1 tablet (100 mg total) by mouth daily.  30 tablet  3  . nitroGLYCERIN (NITROSTAT) 0.4 MG SL tablet Place 1 tablet (0.4 mg total) under the tongue every 5 (five) minutes as needed for chest pain.  [place]  tablet  0  . predniSONE (DELTASONE) 5 MG tablet Take 2.5 mg by mouth daily.       No facility-administered medications prior to visit.    PE: Blood pressure 147/88, pulse 64, temperature 98.7 F (37.1 C), temperature source Temporal, resp. rate 18, height 5' 8.5" (1.74 m), weight 183 lb (83.008 kg), SpO2 99.00%. Gen: Alert, well appearing.  Patient is oriented to person, place, time, and situation. Knees: minimal, if any, right knee swelling anteriorly.  Mild warmth to right anterior knee region.  No erythema. Mild peri-patellar tenderness to palpation, mild pop region TTP. ROM intact but slightly painful in full extension and full flexion.  No knee instability.   IMPRESSION AND PLAN:  1) DM 2, sounds like control not ideal. 2) HTN, fair control based on today's measurement (off med x 3d). 3) Hyperlip: per pt control is good based on labs in [place] 08/2012.  Not fasting today. Wife to  bring his meds to office tomorrow so I can look these over and try to best determine how to proceed with RF's. No rx's given today.  Check HbA1c, BMET, and urine microalb/cr today.  4) Right knee pain, suspect osteoarthritis.  Knee plain films ordered.  Start meloxicam 15mg  qd prn, #30, RF x 1.  5) Hx of pituitary macroadenoma: needs f/u with his [facility] neurosurgeon.  He will get together with his daughter and arrange this.  Spent 30 min with pt today, with >50% of this time spent in counseling and care coordination regarding the above problems.  FOLLOW UP: 6 wks f/u DM and knee pain

## 2013-03-10 NOTE — Progress Notes (Signed)
Pre visit review using our clinic review tool, if applicable. No additional management support is needed unless otherwise documented below in the visit note. 

## 2013-03-10 NOTE — Patient Instructions (Signed)
[facility] neurosurgery program (this is where you went for your pituitary tumor in 2013) for a follow up appointment.

## 2013-03-11 ENCOUNTER — Other Ambulatory Visit: Payer: Self-pay | Admitting: Family Medicine

## 2013-03-11 ENCOUNTER — Telehealth: Payer: Self-pay

## 2013-03-11 MED ORDER — ATORVASTATIN CALCIUM 20 MG PO TABS: 20.0000 mg | ORAL_TABLET | Freq: Every day | ORAL | Status: AC

## 2013-03-11 MED ORDER — LOSARTAN POTASSIUM 100 MG PO TABS: 100.0000 mg | ORAL_TABLET | Freq: Every day | ORAL | Status: AC

## 2013-03-11 MED ORDER — INSULIN GLARGINE 100 UNIT/ML SOLOSTAR PEN: PEN_INJECTOR | SUBCUTANEOUS | Status: AC

## 2013-03-11 MED ORDER — INSULIN ASPART 100 UNIT/ML ~~LOC~~ SOLN: SUBCUTANEOUS | Status: DC

## 2013-03-11 NOTE — Telephone Encounter (Signed)
Relevant patient education mailed to patient.  

## 2013-03-18 ENCOUNTER — Ambulatory Visit (HOSPITAL_BASED_OUTPATIENT_CLINIC_OR_DEPARTMENT_OTHER)
Admission: RE | Admit: 2013-03-18 | Discharge: 2013-03-18 | Disposition: A | Discharge status: Home / Self Care | Payer: PRIVATE HEALTH INSURANCE | Source: Ambulatory Visit | Attending: Family Medicine | Admitting: Family Medicine

## 2013-03-18 DIAGNOSIS — G8929 Other chronic pain: Secondary | ICD-10-CM | POA: Insufficient documentation

## 2013-03-18 DIAGNOSIS — M25569 Pain in unspecified knee: Secondary | ICD-10-CM | POA: Insufficient documentation

## 2013-03-18 DIAGNOSIS — M25561 Pain in right knee: Secondary | ICD-10-CM

## 2013-04-04 ENCOUNTER — Telehealth: Payer: Self-pay | Admitting: Family Medicine

## 2013-04-04 NOTE — Telephone Encounter (Signed)
Relevant patient education mailed to patient.  

## 2013-04-07 ENCOUNTER — Telehealth: Payer: Self-pay | Admitting: Family Medicine

## 2013-04-07 NOTE — Telephone Encounter (Signed)
Patient was advised to take the medication that Dr. Anitra Lauth gave him an Rx for arthritis in his knee. Patient only had enough medicine for 10 days. Should he get another Rx for the same thing or should he get something else? Please call Ally back.

## 2013-04-08 NOTE — Telephone Encounter (Signed)
Janann Colonel of the information.  She will talk to patient and let us know if he has taken more than one meloxicam daily and if he has then she will make a lab appointment for patient to get his kidney functions checked.

## 2013-04-08 NOTE — Telephone Encounter (Signed)
Please advise 

## 2013-04-08 NOTE — Telephone Encounter (Signed)
I rx'd meloxicam and the instructions on the rx were to take this ONCE daily, and I gave him #30, with 1 additional RF. Pls notify Radonna Ricker that it sounds like he has been taking it incorrectly.  No different med is needed. If he has indeed been taking it 3 times per day then I recommend he stop this med for now and take NO MEDS for pain for about a week and then we need to check his kidney function (lab visit only) and then we'll restart a med for arthritis again.-thx

## 2013-04-18 ENCOUNTER — Ambulatory Visit (INDEPENDENT_AMBULATORY_CARE_PROVIDER_SITE_OTHER): Payer: PRIVATE HEALTH INSURANCE | Admitting: Family Medicine

## 2013-04-18 ENCOUNTER — Encounter: Payer: Self-pay | Admitting: Family Medicine

## 2013-04-18 VITALS — BP 150/75 | HR 56 | Temp 97.9°F | Resp 18 | Ht 68.5 in | Wt 186.0 lb

## 2013-04-18 DIAGNOSIS — IMO0002 Reserved for concepts with insufficient information to code with codable children: Secondary | ICD-10-CM

## 2013-04-18 DIAGNOSIS — M1710 Unilateral primary osteoarthritis, unspecified knee: Secondary | ICD-10-CM

## 2013-04-18 DIAGNOSIS — M179 Osteoarthritis of knee, unspecified: Secondary | ICD-10-CM

## 2013-04-18 NOTE — Progress Notes (Signed)
OFFICE NOTE  04/18/2013  CC:  Chief Complaint  Patient presents with  . Leg Pain     HPI: Patient is a 63 y.o. Asian male who is here for 6 wk f/u right knee pain. X-ray showed mild degenerative changes.  Has been on NSAID. No help from NSAID use.  Now both knees hurt.  No swelling or redness. No other joint pain complaints.  Describes hx of synvisc-type injections in knee (s) in [place] about 4-6 mo ago. He denies hx of steroid injection in the past.  Pertinent PMH:  Past medical, surgical, social, and family history reviewed and no changes are noted since last office visit.  MEDS:  Outpatient Prescriptions Prior to Visit  Medication Sig Dispense Refill  . aspirin 81 MG tablet Take 81 mg by mouth daily.        Marland Kitchen atorvastatin (LIPITOR) 20 MG tablet Take 1 tablet (20 mg total) by mouth daily.  90 tablet  1  . cabergoline (DOSTINEX) 0.5 MG tablet Take 0.25 mg by mouth 2 (two) times a week.      . insulin aspart (NOVOLOG) 100 UNIT/ML injection 12 U SQ qAC  30 mL  1  . Insulin Glargine (LANTUS SOLOSTAR) 100 UNIT/ML Solostar Pen 12 U SQ qhs  15 mL  6  . losartan (COZAAR) 100 MG tablet Take 1 tablet (100 mg total) by mouth daily.  90 tablet  1  . meloxicam (MOBIC) 15 MG tablet 1 tab po qd prn knee pain  30 tablet  1  . Insulin Pen Needle (B-D UF III MINI PEN NEEDLES) 31G X 5 MM MISC Use as directed for insulin injections  30 each  2  . nitroGLYCERIN (NITROSTAT) 0.4 MG SL tablet Place 1 tablet (0.4 mg total) under the tongue every 5 (five) minutes as needed for chest pain.  30 tablet  0   No facility-administered medications prior to visit.    PE: Blood pressure 150/75, pulse 56, temperature 97.9 F (36.6 C), temperature source Temporal, resp. rate 18, height 5' 8.5" (1.74 m), weight 186 lb (84.369 kg), SpO2 99.00%. Gen: Alert, well appearing.  Patient is oriented to person, place, time, and situation. Knees: no warmth, erythema, or swelling.  No tenderness in tendons or collateral  ligament regions.  Mild peripatellar TTP bilat.  No popliteal mass but very mild inferior popliteal region TTP, R>L. No knee instability.  Knee ROM normal bilat, w/out crepitus.  IMPRESSION AND PLAN:  Bilateral knee pain c/w symptomatic osteoarthritis. X-ray of right knee recently was consistent with this. Failed adequate trial of daily NSAID.  He elected treatment with steroid injection in knee today.  I'll do right knee today, as it seems to be more symptomatic lately.  If good results from this then will have him return for steroid injection in left knee.   Procedure: Therapeutic right knee injection.  The patient's clinical condition is marked by substantial pain and/or significant functional disability.  Other conservative therapy has not provided relief, is contraindicated, or not appropriate.  There is a reasonable likelihood that injection will significantly improve the patient's pain and/or functional disability. Cleaned skin with alcohol swab, used lateral approach with pt in upright position to enter knee joint, Injected 1  ml of 40mg /ml depo medrol + 2 ml of 1% lidocaine w/out epi into joint space without resistance.  No immediate complications.  Patient tolerated procedure well.  Post-injection care discussed, including 20 min of icing 1-2 times in the next 4-8 hours, frequent  non weight-bearing ROM exercises over the next few days, and general pain medication management.   FOLLOW UP: prn

## 2013-04-20 ENCOUNTER — Telehealth: Payer: Self-pay | Admitting: Family Medicine

## 2013-04-20 NOTE — Telephone Encounter (Signed)
Relevant patient education mailed to patient.  

## 2013-04-28 ENCOUNTER — Ambulatory Visit: Payer: PRIVATE HEALTH INSURANCE | Admitting: Family Medicine

## 2013-04-29 ENCOUNTER — Encounter: Payer: Self-pay | Admitting: Family Medicine

## 2013-04-29 ENCOUNTER — Ambulatory Visit (INDEPENDENT_AMBULATORY_CARE_PROVIDER_SITE_OTHER): Payer: PRIVATE HEALTH INSURANCE | Admitting: Family Medicine

## 2013-04-29 VITALS — BP 120/72 | HR 77 | Temp 98.6°F | Resp 18 | Ht 68.5 in | Wt 188.0 lb

## 2013-04-29 DIAGNOSIS — M179 Osteoarthritis of knee, unspecified: Secondary | ICD-10-CM

## 2013-04-29 DIAGNOSIS — M1710 Unilateral primary osteoarthritis, unspecified knee: Secondary | ICD-10-CM

## 2013-04-29 DIAGNOSIS — IMO0002 Reserved for concepts with insufficient information to code with codable children: Secondary | ICD-10-CM

## 2013-04-29 NOTE — Progress Notes (Signed)
OFFICE NOTE  04/29/2013  CC:  Chief Complaint  Patient presents with  . Follow-up    pt wants L sided knee injection     HPI: Patient is a 63 y.o. Asian male who is here for "wants left knee injected". Hx of osteoarthritis of knees.   Eleven days ago I did steroid injection into right knee and he got good relief of pain (says essentially no pain now). Right knee pain at intensity of 6-8 when bears weight.  Milder when sitting.   He noted his glucoses did go up a little bit after recent injection: 130s fasting, 2H PP about 150.  Pertinent PMH:  Past medical, surgical, social, and family history reviewed and no changes are noted since last office visit.  MEDS:  Outpatient Prescriptions Prior to Visit  Medication Sig Dispense Refill  . aspirin 81 MG tablet Take 81 mg by mouth daily.        Marland Kitchen atorvastatin (LIPITOR) 20 MG tablet Take 1 tablet (20 mg total) by mouth daily.  90 tablet  1  . cabergoline (DOSTINEX) 0.5 MG tablet Take 0.25 mg by mouth 2 (two) times a week.      . insulin aspart (NOVOLOG) 100 UNIT/ML injection 12 U SQ qAC  30 mL  1  . Insulin Glargine (LANTUS SOLOSTAR) 100 UNIT/ML Solostar Pen 12 U SQ qhs  15 mL  6  . Insulin Pen Needle (B-D UF III MINI PEN NEEDLES) 31G X 5 MM MISC Use as directed for insulin injections  30 each  2  . losartan (COZAAR) 100 MG tablet Take 1 tablet (100 mg total) by mouth daily.  90 tablet  1  . meloxicam (MOBIC) 15 MG tablet 1 tab po qd prn knee pain  30 tablet  1  . nitroGLYCERIN (NITROSTAT) 0.4 MG SL tablet Place 1 tablet (0.4 mg total) under the tongue every 5 (five) minutes as needed for chest pain.  30 tablet  0   No facility-administered medications prior to visit.    PE: Blood pressure 120/72, pulse 77, temperature 98.6 F (37 C), temperature source Temporal, resp. rate 18, height 5' 8.5" (1.74 m), weight 188 lb (85.276 kg), SpO2 97.00%. Gen: Alert, well appearing.  Patient is oriented to person, place, time, and situation. Left  knee: No swelling, warmth, or erythema.  No deformity. ROM intact.  Mild TTP around patella and in pop fossa. No mass in pop fossa.  IMPRESSION AND PLAN:  Knee osteoarthritis  Procedure: Therapeutic left knee injection.  The patient's clinical condition is marked by substantial pain and/or significant functional disability.  Other conservative therapy has not provided relief, is contraindicated, or not appropriate.  There is a reasonable likelihood that injection will significantly improve the patient's pain and/or functional disability. Cleaned skin with alcohol swab, used medial approach with patient sitting upright (knee at 90 deg angle) to enter knee joint, Injected  49ml of 40mg /ml depo medrol + 2 ml of 1% lido w/out epi into joint space without resistance.  No immediate complications.  Patient tolerated procedure well.  Post-injection care discussed, including 20 min of icing 1-2 times in the next 4-8 hours, frequent non weight-bearing ROM exercises over the next few days, and general pain medication management.    An After Visit Summary was printed and given to the patient.  FOLLOW UP: 31mo, f/u DM

## 2013-04-29 NOTE — Assessment & Plan Note (Signed)
  Procedure: Therapeutic left knee injection.  The patient's clinical condition is marked by substantial pain and/or significant functional disability.  Other conservative therapy has not provided relief, is contraindicated, or not appropriate.  There is a reasonable likelihood that injection will significantly improve the patient's pain and/or functional disability. Cleaned skin with alcohol swab, used medial approach with patient sitting upright (knee at 90 deg angle) to enter knee joint, Injected  14ml of 40mg /ml depo medrol + 2 ml of 1% lido w/out epi into joint space without resistance.  No immediate complications.  Patient tolerated procedure well.  Post-injection care discussed, including 20 min of icing 1-2 times in the next 4-8 hours, frequent non weight-bearing ROM exercises over the next few days, and general pain medication management.

## 2013-04-29 NOTE — Progress Notes (Signed)
Pre visit review using our clinic review tool, if applicable. No additional management support is needed unless otherwise documented below in the visit note. 

## 2013-05-02 ENCOUNTER — Telehealth: Payer: Self-pay | Admitting: Family Medicine

## 2013-05-02 NOTE — Telephone Encounter (Signed)
Relevant patient education mailed to patient.  

## 2013-05-09 ENCOUNTER — Other Ambulatory Visit: Payer: Self-pay | Admitting: Family Medicine

## 2013-05-09 ENCOUNTER — Telehealth: Payer: Self-pay | Admitting: Family Medicine

## 2013-05-09 DIAGNOSIS — M1710 Unilateral primary osteoarthritis, unspecified knee: Secondary | ICD-10-CM

## 2013-05-09 DIAGNOSIS — M179 Osteoarthritis of knee, unspecified: Secondary | ICD-10-CM

## 2013-05-09 DIAGNOSIS — M25562 Pain in left knee: Secondary | ICD-10-CM

## 2013-05-09 NOTE — Telephone Encounter (Signed)
Patient is not having any redness or swelling just pain.   Please advise.

## 2013-05-09 NOTE — Telephone Encounter (Signed)
Alli called stating the patient said his left knee is hurting very badly since injection.  Is there anything pt needs to do or should he come into office?  Please advise.

## 2013-05-09 NOTE — Telephone Encounter (Signed)
LMOM for Alli to find out what the knee looks like and call me back so we know what to do next.

## 2013-05-09 NOTE — Telephone Encounter (Signed)
As long as no redness or swelling to the knee, then I just recommend we get an x-ray of it and I'll refer him to an orthopedist. If it is red all over OR if it is swollen, then I'll need him to come in for exam of the knee before doing anything else. -thx

## 2013-05-09 NOTE — Telephone Encounter (Signed)
  I'll enter an order for referral to [place].  They will do an x-ray of his knee there, so I won't order one before he goes to see them.-thx

## 2013-05-09 NOTE — Telephone Encounter (Signed)
Called pt, unable to leave message.

## 2013-05-16 ENCOUNTER — Telehealth: Payer: Self-pay | Admitting: Family Medicine

## 2013-05-16 DIAGNOSIS — M25562 Pain in left knee: Secondary | ICD-10-CM

## 2013-05-16 DIAGNOSIS — M1712 Unilateral primary osteoarthritis, left knee: Secondary | ICD-10-CM

## 2013-05-16 NOTE — Telephone Encounter (Signed)
I wonder if his insurance would cover it as a sports medicine consult?   I'll enter referral order to Dr. Tamala Julian at [facility] and we'll see. No x-ray at this time.

## 2013-05-16 NOTE — Telephone Encounter (Signed)
Patient's insurance will not cover ortho visit. Patient is requesting an xray to help decide what to do.

## 2013-05-16 NOTE — Telephone Encounter (Signed)
What should he do next? It looks like you gave him and injection and advised to RTC if not better on 04/29/2013. Please advise.

## 2013-05-16 NOTE — Telephone Encounter (Signed)
Spoke with Radonna Ricker and gave message from Dr Anitra Lauth. She understood and will await appt from [facility]

## 2013-05-20 ENCOUNTER — Encounter: Payer: Self-pay | Admitting: Family Medicine

## 2013-05-20 ENCOUNTER — Telehealth: Payer: Self-pay | Admitting: Family Medicine

## 2013-05-20 ENCOUNTER — Other Ambulatory Visit (INDEPENDENT_AMBULATORY_CARE_PROVIDER_SITE_OTHER): Payer: Self-pay

## 2013-05-20 ENCOUNTER — Ambulatory Visit (INDEPENDENT_AMBULATORY_CARE_PROVIDER_SITE_OTHER): Payer: Self-pay | Admitting: Family Medicine

## 2013-05-20 VITALS — BP 110/60 | HR 65

## 2013-05-20 DIAGNOSIS — M25562 Pain in left knee: Secondary | ICD-10-CM

## 2013-05-20 DIAGNOSIS — M25569 Pain in unspecified knee: Secondary | ICD-10-CM

## 2013-05-20 DIAGNOSIS — M1710 Unilateral primary osteoarthritis, unspecified knee: Secondary | ICD-10-CM | POA: Insufficient documentation

## 2013-05-20 DIAGNOSIS — M23209 Derangement of unspecified meniscus due to old tear or injury, unspecified knee: Secondary | ICD-10-CM

## 2013-05-20 DIAGNOSIS — M23302 Other meniscus derangements, unspecified lateral meniscus, unspecified knee: Secondary | ICD-10-CM

## 2013-05-20 MED ORDER — TRAMADOL HCL 50 MG PO TABS: 50.0000 mg | ORAL_TABLET | Freq: Two times a day (BID) | ORAL | Status: DC | PRN

## 2013-05-20 NOTE — Assessment & Plan Note (Signed)
She does have osteoarthritis of both legs as well as an acute on chronic meniscal tear that likely is contributing to his pain. We discussed about conservative therapy including over-the-counter medications, Mardene Celeste medications, home exercise program and was given a brace today that was fitted by me. Patient may have another repeat steroid injection in 2 months if necessary. Patient is also a candidate for viscous supplementation. Patient describes having this type of procedure done greater than one year ago in [place] previously with good results. Patient would like to avoid surgery at all cost which I agree with. Patient will come back again as stated in patient instructions

## 2013-05-20 NOTE — Telephone Encounter (Signed)
Patient is inquiring about blood work. Have records come in from [place]? Patient mentioned that blood work would be done here instead of driving to [place].

## 2013-05-20 NOTE — Progress Notes (Signed)
[facility] [place], [place] [address] Phone: [phone] Subjective:    I'm seeing this patient by the request  of:  MCGOWEN,PHILIP H, MD  Patient is accompanied with a interpreter CC: Knee pain  IEP:[id number] [facility] is a 63 y.o. male coming in with complaint of bilateral knee pain. Patient was seen by primary care physician approximately one month ago and was given injection into the right knee. Before that he had an injection in his left knee. Patient states he did respond well to the injections but unfortunately it is starting have the pain again. Patient did have x-rays of the knee there were reviewed by me today. X-rays back in January of 2015 this the patient had mild degenerative changes of the right knee but otherwise fairly unremarkable. Patient describes the pain to do dual aching sensation with some mild radiation down the legs. Patient states it is hard to walk long distances secondary to the pain. Patient does like to jog and unfortunately has not been able to jog deep to these severity of 710 pain the pain is more of a dull aching sensation that can last all day. Patient was given an injection in [place] as well before he came over.    Past medical history, social, surgical and family history all reviewed in electronic medical record.   Review of Systems: No headache, visual changes, nausea, vomiting, diarrhea, constipation, dizziness, abdominal pain, skin rash, fevers, chills, night sweats, weight loss, swollen lymph nodes, body aches, joint swelling, muscle aches, chest pain, shortness of breath, mood changes.   Objective Blood pressure 110/60, pulse 65, SpO2 96.00%.  General: No apparent distress alert and oriented x3 mood and affect normal, dressed appropriately.  HEENT: Pupils equal, extraocular movements intact  Respiratory: Patient's speak in full sentences and does not appear short of breath  Cardiovascular: No lower  extremity edema, non tender, no erythema  Skin: Warm dry intact with no signs of infection or rash on extremities or on axial skeleton.  Abdomen: Soft nontender  Neuro: Cranial nerves II through XII are intact, neurovascularly intact in all extremities with 2+ DTRs and 2+ pulses.  Lymph: No lymphadenopathy of posterior or anterior cervical chain or axillae bilaterally.  Gait normal with good balance and coordination.  MSK:  Non tender with full range of motion and good stability and symmetric strength and tone of shoulders, elbows, wrist, hip,  and ankles bilaterally.  Knee: Left Normal to inspection with no erythema or effusion or obvious bony abnormalities. Palpation normal with no warmth, mild joint line tenderness, patellar tenderness, and condyle tenderness. ROM full in flexion and extension and lower leg rotation. Ligaments with solid consistent endpoints including ACL, PCL, LCL, MCL. Negative Mcmurray's, Apley's, and Thessalonian tests. Non painful patellar compression. Patellar glide without crepitus. Patellar and quadriceps tendons unremarkable. Hamstring and quadriceps strength is normal.   MSK US performed of: Left knee This study was ordered, performed, and interpreted by Charlann Boxer D.O.  Knee: All structures visualized. Anteromedial meniscus does have what appears to be degenerative changes and appears to have a potential acute on chronic tear. Patient does have narrowing of the medial and lateral joint lines secondary to early arthritis. anterolateral, posteromedial, and posterolateral menisci unremarkable without tearing, fraying, effusion, or displacement. Patellar Tendon unremarkable on long and transverse views without effusion. No abnormality of prepatellar bursa. LCL and MCL unremarkable on long and transverse views. No abnormality of origin of medial or lateral head of  the gastrocnemius.  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

## 2013-05-20 NOTE — Patient Instructions (Addendum)
Very nice to meet you Try exercises 4 times a week  Wear the brace with jogging and can wear it during the day if it helps.  Ice 20 minutes 2 times a day I am giving you a medicine called tramadol that can help with the pain but may make you sleepy.  Take tylenol 650 mg three times a day is the best evidence based medicine we have for arthritis.  Glucosamine sulfate 750mg  twice a day is a supplement that has been shown to help moderate to severe arthritis. Vitamin D 2000 IU daily Fish oil 2 grams daily.  Tumeric 500mg  twice daily.  Capsaicin topically up to four times a day may also help with pain. If cortisone injections do not help, there are different types of shots that may help but they take longer to take effect.  We can discuss this at follow up. I gave yo [place] handout.  It's important that you continue to stay active. Controlling your weight is important.  Consider physical therapy to strengthen muscles around the joint that hurts to take pressure off of the joint itself. Shoe inserts with good arch support may be helpful.  [employer] orthotics at [employer] could help.  Water aerobics and cycling with low resistance are the best two types of exercise for arthritis.  Come back again in 6 weeks and if still in pain we can do injections.

## 2013-05-22 NOTE — Telephone Encounter (Signed)
I haven't received any records yet. I'm okay with doing bloodwork but we need to allow more time for records to come so I can get a better idea of what exact blood work they are referring to. -thx

## 2013-05-23 ENCOUNTER — Telehealth: Payer: Self-pay | Admitting: Family Medicine

## 2013-05-23 NOTE — Telephone Encounter (Signed)
Relevant patient education mailed to patient.  

## 2013-06-10 ENCOUNTER — Telehealth: Payer: Self-pay | Admitting: Family Medicine

## 2013-06-10 ENCOUNTER — Other Ambulatory Visit: Payer: Self-pay

## 2013-06-10 DIAGNOSIS — D352 Benign neoplasm of pituitary gland: Secondary | ICD-10-CM

## 2013-06-10 NOTE — Telephone Encounter (Signed)
Pls call interpreter and schedule lab visit for pt (these labs are routine f/u labs for his history of pituitary tumor---they have previously been done at [facility] but pt requested we do them here)--thx

## 2013-06-15 NOTE — Telephone Encounter (Signed)
Left Ally a message to call to schedule appt

## 2013-06-16 NOTE — Telephone Encounter (Signed)
Yes, he needs to be fasting.-thx

## 2013-06-16 NOTE — Telephone Encounter (Signed)
Fasting

## 2013-06-16 NOTE — Telephone Encounter (Signed)
Does patient need to fast ?

## 2013-06-20 ENCOUNTER — Other Ambulatory Visit (INDEPENDENT_AMBULATORY_CARE_PROVIDER_SITE_OTHER): Payer: PRIVATE HEALTH INSURANCE

## 2013-06-20 DIAGNOSIS — D353 Benign neoplasm of craniopharyngeal duct: Secondary | ICD-10-CM

## 2013-06-20 DIAGNOSIS — D352 Benign neoplasm of pituitary gland: Secondary | ICD-10-CM

## 2013-06-20 LAB — CBC WITH DIFFERENTIAL/PLATELET
BASOS PCT: 0.5 % (ref 0.0–3.0)
Basophils Absolute: 0 10*3/uL (ref 0.0–0.1)
Eosinophils Absolute: 0.1 10*3/uL (ref 0.0–0.7)
Eosinophils Relative: 1.3 % (ref 0.0–5.0)
HEMATOCRIT: 43.1 % (ref 39.0–52.0)
HEMOGLOBIN: 14.4 g/dL (ref 13.0–17.0)
Lymphocytes Relative: 25.6 % (ref 12.0–46.0)
Lymphs Abs: 1.3 10*3/uL (ref 0.7–4.0)
MCHC: 33.4 g/dL (ref 30.0–36.0)
MCV: 95.3 fl (ref 78.0–100.0)
MONO ABS: 0.3 10*3/uL (ref 0.1–1.0)
Monocytes Relative: 6.6 % (ref 3.0–12.0)
NEUTROS ABS: 3.4 10*3/uL (ref 1.4–7.7)
Neutrophils Relative %: 66 % (ref 43.0–77.0)
Platelets: 125 10*3/uL — ABNORMAL LOW (ref 150.0–400.0)
RBC: 4.52 Mil/uL (ref 4.22–5.81)
RDW: 14 % (ref 11.5–14.6)
WBC: 5.1 10*3/uL (ref 4.5–10.5)

## 2013-06-20 LAB — COMPREHENSIVE METABOLIC PANEL
ALK PHOS: 51 U/L (ref 39–117)
ALT: 17 U/L (ref 0–53)
AST: 15 U/L (ref 0–37)
Albumin: 4 g/dL (ref 3.5–5.2)
BUN: 19 mg/dL (ref 6–23)
CALCIUM: 8.9 mg/dL (ref 8.4–10.5)
CO2: 27 mEq/L (ref 19–32)
CREATININE: 0.8 mg/dL (ref 0.4–1.5)
Chloride: 106 mEq/L (ref 96–112)
GFR: 100.89 mL/min (ref >60.00)
GLUCOSE: 119 mg/dL — AB (ref 70–99)
Potassium: 3.9 mEq/L (ref 3.5–5.1)
Sodium: 141 mEq/L (ref 135–145)
Total Bilirubin: 1.1 mg/dL (ref 0.3–1.2)
Total Protein: 6.7 g/dL (ref 6.0–8.3)

## 2013-06-20 LAB — CORTISOL: Cortisol, Plasma: 10.9 ug/dL

## 2013-06-20 LAB — PROLACTIN: PROLACTIN: 4.5 ng/mL (ref 2.1–17.1)

## 2013-06-20 LAB — T3, FREE: T3, Free: 3.3 pg/mL (ref 2.3–4.2)

## 2013-06-20 LAB — TSH: TSH: 1.09 u[IU]/mL (ref 0.35–5.50)

## 2013-06-20 LAB — T4, FREE: Free T4: 0.83 ng/dL (ref 0.60–1.60)

## 2013-06-21 LAB — GROWTH HORMONE

## 2013-06-21 LAB — TESTOSTERONE, FREE, TOTAL, SHBG
SEX HORMONE BINDING: 44 nmol/L (ref 13–71)
TESTOSTERONE FREE: 74.2 pg/mL (ref 47.0–244.0)
TESTOSTERONE: 434 ng/dL (ref 300–890)
Testosterone-% Free: 1.7 % (ref 1.6–2.9)

## 2013-06-21 NOTE — Telephone Encounter (Signed)
Patient already had lab appointment here yesterday on 06/20/13.

## 2013-06-21 NOTE — Telephone Encounter (Signed)
Patient is scheduled to go to the [facility] lab 07/01/13. Are the orders in EPIC?

## 2013-06-22 LAB — ACTH: C206 ACTH: 19 pg/mL (ref 6–50)

## 2013-06-27 ENCOUNTER — Ambulatory Visit: Payer: PRIVATE HEALTH INSURANCE | Admitting: Family Medicine

## 2013-06-27 ENCOUNTER — Ambulatory Visit (INDEPENDENT_AMBULATORY_CARE_PROVIDER_SITE_OTHER): Payer: Self-pay | Admitting: Family Medicine

## 2013-06-27 ENCOUNTER — Encounter: Payer: Self-pay | Admitting: Family Medicine

## 2013-06-27 VITALS — BP 135/78 | HR 58 | Temp 98.0°F | Resp 18 | Ht 68.5 in | Wt 182.0 lb

## 2013-06-27 DIAGNOSIS — I10 Essential (primary) hypertension: Secondary | ICD-10-CM

## 2013-06-27 DIAGNOSIS — E1165 Type 2 diabetes mellitus with hyperglycemia: Principal | ICD-10-CM

## 2013-06-27 DIAGNOSIS — M23302 Other meniscus derangements, unspecified lateral meniscus, unspecified knee: Secondary | ICD-10-CM

## 2013-06-27 DIAGNOSIS — D353 Benign neoplasm of craniopharyngeal duct: Secondary | ICD-10-CM

## 2013-06-27 DIAGNOSIS — IMO0001 Reserved for inherently not codable concepts without codable children: Secondary | ICD-10-CM

## 2013-06-27 DIAGNOSIS — M23209 Derangement of unspecified meniscus due to old tear or injury, unspecified knee: Secondary | ICD-10-CM

## 2013-06-27 DIAGNOSIS — D352 Benign neoplasm of pituitary gland: Secondary | ICD-10-CM

## 2013-06-27 DIAGNOSIS — E785 Hyperlipidemia, unspecified: Secondary | ICD-10-CM

## 2013-06-27 LAB — LIPID PANEL
CHOLESTEROL: 138 mg/dL (ref 0–200)
HDL: 40 mg/dL (ref >39.00)
LDL CALC: 75 mg/dL (ref 0–99)
TRIGLYCERIDES: 114 mg/dL (ref 0.0–149.0)
Total CHOL/HDL Ratio: 3
VLDL: 22.8 mg/dL (ref 0.0–40.0)

## 2013-06-27 LAB — HEMOGLOBIN A1C: Hgb A1c MFr Bld: 6.6 % — ABNORMAL HIGH (ref 4.6–6.5)

## 2013-06-27 NOTE — Progress Notes (Signed)
Pre visit review using our clinic review tool, if applicable. No additional management support is needed unless otherwise documented below in the visit note. 

## 2013-06-27 NOTE — Progress Notes (Signed)
OFFICE NOTE  06/30/2013  CC:  Chief Complaint  Patient presents with  . Follow-up     HPI: Patient is a 63 y.o. [place] male who is here for routine chronic illness f/u. Recent labs for pit microad f/u done her (per [facility] orders) were all normal, including prolactin level. Needs MRI pituitary scheduled per their recommendations as well.  Says his vision still always feels blurry.  Saw Dr. Tamala Julian for knee pains, osteoarthritis documented, plus acute on chronic meniscal tear. Has f/u with him soon.  He has questions about RA and gouty arthritis today and wants blood tests.    Glucoses: checking glucoses about once per week.  One measurement was 110, one was 200.   Compliant with all 4 insulin injections:  12 Lantus, mealtime insulin 10, 10, 15.   Pertinent PMH:  Past Medical History  Diagnosis Date  . Diabetes mellitus 2012  . Hemorrhagic, fever 1993    No residual effects  . Coronary artery disease 2011    Stents x3, put on plavix and ASA--with further questioning he apparently had no symptoms but he told an MD in [place] that he had some SOB at times and the MD put the stents in as a PROPHYLAXIS against CAD (pt says actual ischemia or CAD was NOT confirmed--no records available.  I encouraged patient to go ahead and d/c plavix after he had been on it for one year but he insists on continuing it.  . Tobacco dependence     1/2 ppd x 40 yrs  . 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 Jodi Mourning exam).--saw [facility] neurosurgeon 09/16/11.  Marland Kitchen HTN (hypertension)   . GERD (gastroesophageal reflux disease)     MEDS:  Outpatient Prescriptions Prior to Visit  Medication Sig Dispense Refill  . aspirin 81 MG tablet Take 81 mg by mouth daily.        Marland Kitchen atorvastatin (LIPITOR) 20 MG tablet Take 1 tablet (20 mg total) by mouth daily.  90 tablet  1  . cabergoline (DOSTINEX) 0.5 MG tablet Take 0.25 mg by mouth 2  (two) times a week.      . insulin aspart (NOVOLOG) 100 UNIT/ML injection 12 U SQ qAC  30 mL  1  . Insulin Glargine (LANTUS SOLOSTAR) 100 UNIT/ML Solostar Pen 12 U SQ qhs  15 mL  6  . Insulin Pen Needle (B-D UF III MINI PEN NEEDLES) 31G X 5 MM MISC Use as directed for insulin injections  30 each  2  . losartan (COZAAR) 100 MG tablet Take 1 tablet (100 mg total) by mouth daily.  90 tablet  1  . meloxicam (MOBIC) 15 MG tablet 1 tab po qd prn knee pain  30 tablet  1  . traMADol (ULTRAM) 50 MG tablet Take 1 tablet (50 mg total) by mouth every 12 (twelve) hours as needed.  30 tablet  0  . nitroGLYCERIN (NITROSTAT) 0.4 MG SL tablet Place 1 tablet (0.4 mg total) under the tongue every 5 (five) minutes as needed for chest pain.  30 tablet  0   No facility-administered medications prior to visit.    PE: Blood pressure 135/78, pulse 58, temperature 98 F (36.7 C), temperature source Temporal, resp. rate 18, height 5' 8.5" (1.74 m), weight 182 lb (82.555 kg), SpO2 99.00%. Gen: Alert, well appearing.  Patient is oriented to person, place, time, and situation. Foot exam - both; no swelling, tenderness or vascular  lesions. Color and temperature is normal. Sensation is intact. Peripheral pulses are palpable. Toenails are normal are all hypertrophic/thickened.  Bottom of feet with moccasin type distribution of mildly pink/flaky rash c/w tinea pedis.   IMPRESSION AND PLAN:  Type II or unspecified type diabetes mellitus without mention of complication, uncontrolled HbA1c today. Foot exam showed normal sensation today. Continue current insulin dosing.  Encouraged increased compliance with glucose monitoring as well as diabetic diet.  Hyperlipidemia Check FLP today. Continue statin.  HTN (hypertension), benign The current medical regimen is effective;  continue present plan and medications.   Pituitary macroadenoma As per [facility] recommendations, routine monitoring labs were done recently and they were  all normal. Will order pituitary MRI as per their rec's as well. Will send results of labs and MRI to [facility] neurosurgery dept.  Chronic meniscal tear of knee As per Dr. Thompson Caul mgmt. In my opinion, pt does not have any indication to get any testing for RA or gout. I told him he could also ask Dr. Tamala Julian what he thought about this matter.   An After Visit Summary was printed and given to the patient.  FOLLOW UP: 4 mo

## 2013-06-27 NOTE — Telephone Encounter (Signed)
[facility] came in today and stated pt is requesting cough medication for a cough. He also would like a RX to help him sleep at night. I advised [facility] that he would have to come in for an office visit but she still wanted me to ask Dr. Anitra Lauth. Pleasea advise. Pt states he doesn't want to come in due to his busy schedule.

## 2013-06-28 NOTE — Telephone Encounter (Signed)
I believe this message about cough med and sleep med is about a different patient.  Pls send message under correct patient's name.-thx!

## 2013-06-28 NOTE — Telephone Encounter (Signed)
Sorry, it is the wrong pt.

## 2013-06-30 DIAGNOSIS — E785 Hyperlipidemia, unspecified: Secondary | ICD-10-CM | POA: Insufficient documentation

## 2013-06-30 NOTE — Assessment & Plan Note (Signed)
The current medical regimen is effective;  continue present plan and medications.  

## 2013-06-30 NOTE — Assessment & Plan Note (Signed)
HbA1c today. Foot exam showed normal sensation today. Continue current insulin dosing.  Encouraged increased compliance with glucose monitoring as well as diabetic diet.

## 2013-06-30 NOTE — Assessment & Plan Note (Signed)
As per Dr. Thompson Caul mgmt. In my opinion, pt does not have any indication to get any testing for RA or gout. I told him he could also ask Dr. Tamala Julian what he thought about this matter.

## 2013-06-30 NOTE — Assessment & Plan Note (Signed)
Check FLP today. Continue statin.

## 2013-06-30 NOTE — Assessment & Plan Note (Signed)
As per [facility] recommendations, routine monitoring labs were done recently and they were all normal. Will order pituitary MRI as per their rec's as well. Will send results of labs and MRI to [facility] neurosurgery dept.

## 2013-07-01 ENCOUNTER — Encounter: Payer: Self-pay | Admitting: Family Medicine

## 2013-07-01 ENCOUNTER — Ambulatory Visit (INDEPENDENT_AMBULATORY_CARE_PROVIDER_SITE_OTHER): Payer: Self-pay | Admitting: Family Medicine

## 2013-07-01 VITALS — BP 118/68 | HR 86 | Wt 183.0 lb

## 2013-07-01 DIAGNOSIS — M7710 Lateral epicondylitis, unspecified elbow: Secondary | ICD-10-CM | POA: Insufficient documentation

## 2013-07-01 DIAGNOSIS — M1710 Unilateral primary osteoarthritis, unspecified knee: Secondary | ICD-10-CM

## 2013-07-01 MED ORDER — TRAMADOL HCL 50 MG PO TABS: 50.0000 mg | ORAL_TABLET | Freq: Every evening | ORAL | Status: AC | PRN

## 2013-07-01 NOTE — Progress Notes (Signed)
[facility] [name], [place] [address] Phone: [phone] Subjective:    Patient is accompanied with a interpreter CC: Knee pain follow up.   [id number] [facility] is a 62 y.o. male coming in with complaint of bilateral knee pain.  Patient was seen previously and did have a chronic degenerative meniscal tear noted. Patient is given home exercises, we discussed medications as well as an icing protocol. We also discussed over-the-counter medications and home exercises that could be beneficial for underlying osteoarthritic pain. Patient states overall though his knee is feeling better. Patient states it is about 60% better as long as he wears the brace. Patient has been doing the exercises intermittently. Not taking any medicines on a regular basis to. Patient was unable to get the tramadol for would like something at night because he does have pain that can wake him up sometimes. Patient was able to do all activities of daily living without any significant discomfort. Patient x-ray states that this other knee has started to give him a little bit more trouble.  Patient was seen by primary care physician approximately 2 months ago and was given injection into the right knee. Before that he had an injection in his left knee. Patient states he did respond well to the injections but unfortunately it is starting have the pain again.  X-rays previously taken only some mild osteophytic changes.    Patient also has a new problem with right elbow pain. Patient states it hurts more with certain range of motion but denies any numbness or any tingling or any loss of strength in the upper extremity. Does not range her injury. Describes it as a sharp pain that is worse with certain movements. Has not tried any all modalities.  Past medical history, social, surgical and family history all reviewed in electronic medical record.   Review of Systems: No headache, visual  changes, nausea, vomiting, diarrhea, constipation, dizziness, abdominal pain, skin rash, fevers, chills, night sweats, weight loss, swollen lymph nodes, body aches, joint swelling, muscle aches, chest pain, shortness of breath, mood changes.   Objective Blood pressure 118/68, pulse 86, weight 183 lb (83.008 kg), SpO2 98.00%.  General: No apparent distress alert and oriented x3 mood and affect normal, dressed appropriately.  HEENT: Pupils equal, extraocular movements intact  Respiratory: Patient's speak in full sentences and does not appear short of breath  Cardiovascular: No lower extremity edema, non tender, no erythema  Skin: Warm dry intact with no signs of infection or rash on extremities or on axial skeleton.  Abdomen: Soft nontender  Neuro: Cranial nerves II through XII are intact, neurovascularly intact in all extremities with 2+ DTRs and 2+ pulses.  Lymph: No lymphadenopathy of posterior or anterior cervical chain or axillae bilaterally.  Gait normal with good balance and coordination.  MSK:  Non tender with full range of motion and good stability and symmetric strength and tone of shoulders, , wrist, hip,  and ankles bilaterally.  Knee: Left Normal to inspection with no erythema or effusion or obvious bony abnormalities. Palpation normal with no warmth, mild joint line tenderness, patellar tenderness, and condyle tenderness. No significant change from previous exam. ROM full in flexion and extension and lower leg rotation. Ligaments with solid consistent endpoints including ACL, PCL, LCL, MCL. Negative Mcmurray's, Apley's, and Thessalonian tests. Non painful patellar compression. Patellar glide without crepitus. Patellar and quadriceps tendons unremarkable. Hamstring and quadriceps strength is normal.  Contralateral knee does have some mild  pain over the medial joint line as well.  Elbow: Right Unremarkable to inspection. Range of motion full pronation, supination, flexion,  extension. Strength is full to all of the above directions Stable to varus, valgus stress. Negative moving valgus stress test. Tender to palpation over the lateral epicondylitis especially after resisted extensor common tendon pressure. Ulnar nerve does not sublux. Negative cubital tunnel Tinel's.    Impression and Recommendations:     This case required medical decision making of moderate complexity.

## 2013-07-01 NOTE — Patient Instructions (Signed)
It is good to see you Try exercises at least 3 times a week.  Conitnue the brace and icing Tramadol at night Come back in 6 weeks.

## 2013-07-01 NOTE — Assessment & Plan Note (Signed)
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 Patient is responding very well to conservative therapy to. Patient has had Synvisc injections back in [place] previously as well as steroid injections here greater than 2 months ago. Patient is responding to home exercises and bracing and was given a brace today that was fitted by me for his right knee. Patient will continue exercises regularly and declined formal physical therapy. Patient will come back again in 4 weeks for further evaluation and if any worsening pain we will try another steroid injection. Patient would be a candidate for viscous supplementation if all conservative measures fail.

## 2013-07-01 NOTE — Assessment & Plan Note (Signed)
Lateral Epicondylitis: Elbow anatomy was reviewed, and tendinopathy was explained.  Pt. given a formal rehab program. Series of concentric and eccentric exercises should be done starting with no weight, work up to 1 lb, hammer, etc.  Wrist brace given today and was fitted by me..  Formal PT would be beneficial. Emphasized stretching an cross-friction massage Emphasized proper palms up lifting biomechanics to unload ECRB

## 2013-07-02 ENCOUNTER — Ambulatory Visit (HOSPITAL_BASED_OUTPATIENT_CLINIC_OR_DEPARTMENT_OTHER): Payer: PRIVATE HEALTH INSURANCE

## 2013-07-05 ENCOUNTER — Other Ambulatory Visit: Payer: Self-pay | Admitting: Family Medicine

## 2013-07-06 ENCOUNTER — Other Ambulatory Visit: Payer: Self-pay

## 2013-08-12 ENCOUNTER — Ambulatory Visit: Payer: Self-pay | Admitting: Family Medicine

## 2013-09-16 ENCOUNTER — Ambulatory Visit: Payer: Self-pay | Admitting: Family Medicine

## 2013-09-30 ENCOUNTER — Ambulatory Visit: Payer: Self-pay | Admitting: Family Medicine
